# Patient Record
Sex: Male | Born: 1970 | Race: White | Hispanic: No | Marital: Married | Smoking: Current some day smoker
Health system: Southern US, Community
[De-identification: ages and names within clinical notes are randomized; demographics above are authoritative.]

## PROBLEM LIST (undated history)

## (undated) DIAGNOSIS — K819 Cholecystitis, unspecified: Secondary | ICD-10-CM

## (undated) DIAGNOSIS — F329 Major depressive disorder, single episode, unspecified: Secondary | ICD-10-CM

## (undated) DIAGNOSIS — F419 Anxiety disorder, unspecified: Secondary | ICD-10-CM

## (undated) DIAGNOSIS — G4730 Sleep apnea, unspecified: Secondary | ICD-10-CM

## (undated) DIAGNOSIS — E119 Type 2 diabetes mellitus without complications: Secondary | ICD-10-CM

## (undated) DIAGNOSIS — F1010 Alcohol abuse, uncomplicated: Secondary | ICD-10-CM

## (undated) DIAGNOSIS — I10 Essential (primary) hypertension: Secondary | ICD-10-CM

## (undated) DIAGNOSIS — I5042 Chronic combined systolic (congestive) and diastolic (congestive) heart failure: Secondary | ICD-10-CM

## (undated) DIAGNOSIS — I272 Pulmonary hypertension, unspecified: Secondary | ICD-10-CM

## (undated) DIAGNOSIS — R7989 Other specified abnormal findings of blood chemistry: Secondary | ICD-10-CM

## (undated) DIAGNOSIS — E2740 Unspecified adrenocortical insufficiency: Secondary | ICD-10-CM

## (undated) DIAGNOSIS — E785 Hyperlipidemia, unspecified: Secondary | ICD-10-CM

## (undated) DIAGNOSIS — F32A Depression, unspecified: Secondary | ICD-10-CM

## (undated) HISTORY — DX: Morbid (severe) obesity due to excess calories: E66.01

## (undated) HISTORY — DX: Type 2 diabetes mellitus without complications: E11.9

## (undated) HISTORY — DX: Chronic combined systolic (congestive) and diastolic (congestive) heart failure: I50.42

## (undated) HISTORY — DX: Hyperlipidemia, unspecified: E78.5

## (undated) HISTORY — DX: Anxiety disorder, unspecified: F41.9

## (undated) HISTORY — DX: Pulmonary hypertension, unspecified: I27.20

## (undated) HISTORY — DX: Alcohol abuse, uncomplicated: F10.10

## (undated) HISTORY — DX: Unspecified adrenocortical insufficiency: E27.40

## (undated) HISTORY — DX: Cholecystitis, unspecified: K81.9

## (undated) SURGERY — ERCP, WITH INTERVENTION IF INDICATED
Anesthesia: Choice

---

## 2010-03-31 ENCOUNTER — Inpatient Hospital Stay (HOSPITAL_COMMUNITY): Admission: EM | Admit: 2010-03-31 | Discharge: 2010-04-03 | Payer: Self-pay | Admitting: Emergency Medicine

## 2010-04-01 ENCOUNTER — Ambulatory Visit: Payer: Self-pay | Admitting: Internal Medicine

## 2010-12-24 LAB — DIFFERENTIAL
Basophils Absolute: 0.1 10*3/uL (ref 0.0–0.1)
Basophils Relative: 1 % (ref 0–1)
Blasts: 0 %
Eosinophils Relative: 1 % (ref 0–5)
Lymphocytes Relative: 26 % (ref 12–46)
Lymphs Abs: 1.2 10*3/uL (ref 0.7–4.0)
Metamyelocytes Relative: 0 %
Monocytes Absolute: 0.9 10*3/uL (ref 0.1–1.0)
Monocytes Relative: 6 % (ref 3–12)
Myelocytes: 0 %
nRBC: 0 /100 WBC

## 2010-12-24 LAB — COMPREHENSIVE METABOLIC PANEL
ALT: 248 U/L — ABNORMAL HIGH (ref 0–53)
ALT: 305 U/L — ABNORMAL HIGH (ref 0–53)
ALT: 338 U/L — ABNORMAL HIGH (ref 0–53)
AST: 168 U/L — ABNORMAL HIGH (ref 0–37)
AST: 228 U/L — ABNORMAL HIGH (ref 0–37)
Albumin: 2.8 g/dL — ABNORMAL LOW (ref 3.5–5.2)
Albumin: 3.1 g/dL — ABNORMAL LOW (ref 3.5–5.2)
Albumin: 3.5 g/dL (ref 3.5–5.2)
Alkaline Phosphatase: 514 U/L — ABNORMAL HIGH (ref 39–117)
Alkaline Phosphatase: 525 U/L — ABNORMAL HIGH (ref 39–117)
BUN: 12 mg/dL (ref 6–23)
BUN: 18 mg/dL (ref 6–23)
BUN: 9 mg/dL (ref 6–23)
CO2: 22 mEq/L (ref 19–32)
CO2: 26 mEq/L (ref 19–32)
Calcium: 8.8 mg/dL (ref 8.4–10.5)
Chloride: 100 mEq/L (ref 96–112)
Chloride: 101 mEq/L (ref 96–112)
Chloride: 105 mEq/L (ref 96–112)
Creatinine, Ser: 0.88 mg/dL (ref 0.4–1.5)
GFR calc Af Amer: >60 mL/min (ref >60)
GFR calc Af Amer: >60 mL/min (ref >60)
GFR calc non Af Amer: >60 mL/min (ref >60)
GFR calc non Af Amer: >60 mL/min (ref >60)
Glucose, Bld: 79 mg/dL (ref 70–99)
Glucose, Bld: 86 mg/dL (ref 70–99)
Glucose, Bld: 89 mg/dL (ref 70–99)
Potassium: 3.8 mEq/L (ref 3.5–5.1)
Potassium: 3.9 mEq/L (ref 3.5–5.1)
Potassium: 5.9 mEq/L — ABNORMAL HIGH (ref 3.5–5.1)
Sodium: 132 mEq/L — ABNORMAL LOW (ref 135–145)
Sodium: 134 mEq/L — ABNORMAL LOW (ref 135–145)
Sodium: 135 mEq/L (ref 135–145)
Total Bilirubin: 11.5 mg/dL — ABNORMAL HIGH (ref 0.3–1.2)
Total Protein: 6.6 g/dL (ref 6.0–8.3)
Total Protein: 7.5 g/dL (ref 6.0–8.3)

## 2010-12-24 LAB — URINE MICROSCOPIC-ADD ON

## 2010-12-24 LAB — CBC
HCT: 36.8 % — ABNORMAL LOW (ref 39.0–52.0)
Hemoglobin: 12.8 g/dL — ABNORMAL LOW (ref 13.0–17.0)
Hemoglobin: 13.7 g/dL (ref 13.0–17.0)
MCV: 95.2 fL (ref 78.0–100.0)
MCV: 95.5 fL (ref 78.0–100.0)
Platelets: 241 10*3/uL (ref 150–400)
RBC: 3.76 MIL/uL — ABNORMAL LOW (ref 4.22–5.81)
RBC: 3.86 MIL/uL — ABNORMAL LOW (ref 4.22–5.81)
RDW: 15.1 % (ref 11.5–15.5)
RDW: 15.2 % (ref 11.5–15.5)
WBC: 5.8 10*3/uL (ref 4.0–10.5)
WBC: 6.7 10*3/uL (ref 4.0–10.5)

## 2010-12-24 LAB — HEPATIC FUNCTION PANEL
ALT: 353 U/L — ABNORMAL HIGH (ref 0–53)
AST: 234 U/L — ABNORMAL HIGH (ref 0–37)
Alkaline Phosphatase: 507 U/L — ABNORMAL HIGH (ref 39–117)
Bilirubin, Direct: 8.2 mg/dL — ABNORMAL HIGH (ref 0.0–0.3)
Indirect Bilirubin: 4.2 mg/dL — ABNORMAL HIGH (ref 0.3–0.9)
Total Bilirubin: 12.4 mg/dL — ABNORMAL HIGH (ref 0.3–1.2)

## 2010-12-24 LAB — URINALYSIS, ROUTINE W REFLEX MICROSCOPIC
Ketones, ur: 15 mg/dL — AB
Nitrite: POSITIVE — AB
Protein, ur: NEGATIVE mg/dL
Urobilinogen, UA: 1 mg/dL (ref 0.0–1.0)
pH: 5.5 (ref 5.0–8.0)

## 2010-12-24 LAB — HCV RNA QUANT

## 2010-12-24 LAB — MITOCHONDRIAL ANTIBODIES: Mitochondrial M2 Ab, IgG: NEGATIVE

## 2010-12-24 LAB — ANTI-SMOOTH MUSCLE ANTIBODY, IGG: F-Actin IgG: <20 U (ref <20)

## 2010-12-24 LAB — AMMONIA: Ammonia: 101 umol/L — ABNORMAL HIGH (ref 11–35)

## 2010-12-24 LAB — ANTI-NUCLEAR AB-TITER (ANA TITER): ANA Titer 1: 1:160 {titer} — ABNORMAL HIGH

## 2010-12-24 LAB — PROTIME-INR: INR: 0.87 (ref 0.00–1.49)

## 2010-12-24 LAB — HEPATITIS B SURFACE ANTIGEN: Hepatitis B Surface Ag: NEGATIVE

## 2010-12-24 LAB — HEPATITIS A ANTIBODY, IGM: Hep A IgM: NEGATIVE

## 2015-05-11 ENCOUNTER — Encounter (HOSPITAL_COMMUNITY): Payer: Self-pay | Admitting: Cardiology

## 2015-05-11 ENCOUNTER — Emergency Department (HOSPITAL_COMMUNITY): Payer: Medicaid Other

## 2015-05-11 ENCOUNTER — Ambulatory Visit (HOSPITAL_COMMUNITY)
Admission: EM | Admit: 2015-05-11 | Discharge: 2015-05-17 | Disposition: A | Discharge status: Home / Self Care | Payer: Medicaid Other | Attending: Internal Medicine | Admitting: Internal Medicine

## 2015-05-11 DIAGNOSIS — R748 Abnormal levels of other serum enzymes: Secondary | ICD-10-CM | POA: Insufficient documentation

## 2015-05-11 DIAGNOSIS — F17290 Nicotine dependence, other tobacco product, uncomplicated: Secondary | ICD-10-CM | POA: Insufficient documentation

## 2015-05-11 DIAGNOSIS — I5040 Unspecified combined systolic (congestive) and diastolic (congestive) heart failure: Secondary | ICD-10-CM | POA: Insufficient documentation

## 2015-05-11 DIAGNOSIS — K8020 Calculus of gallbladder without cholecystitis without obstruction: Secondary | ICD-10-CM | POA: Diagnosis present

## 2015-05-11 DIAGNOSIS — E860 Dehydration: Secondary | ICD-10-CM | POA: Insufficient documentation

## 2015-05-11 DIAGNOSIS — N179 Acute kidney failure, unspecified: Secondary | ICD-10-CM | POA: Diagnosis present

## 2015-05-11 DIAGNOSIS — Z6841 Body Mass Index (BMI) 40.0 and over, adult: Secondary | ICD-10-CM | POA: Diagnosis not present

## 2015-05-11 DIAGNOSIS — F419 Anxiety disorder, unspecified: Secondary | ICD-10-CM | POA: Insufficient documentation

## 2015-05-11 DIAGNOSIS — I272 Other secondary pulmonary hypertension: Secondary | ICD-10-CM | POA: Insufficient documentation

## 2015-05-11 DIAGNOSIS — R109 Unspecified abdominal pain: Secondary | ICD-10-CM | POA: Diagnosis present

## 2015-05-11 DIAGNOSIS — R17 Unspecified jaundice: Secondary | ICD-10-CM | POA: Insufficient documentation

## 2015-05-11 DIAGNOSIS — Z79899 Other long term (current) drug therapy: Secondary | ICD-10-CM | POA: Diagnosis not present

## 2015-05-11 DIAGNOSIS — I429 Cardiomyopathy, unspecified: Secondary | ICD-10-CM

## 2015-05-11 DIAGNOSIS — G4730 Sleep apnea, unspecified: Secondary | ICD-10-CM

## 2015-05-11 DIAGNOSIS — G4733 Obstructive sleep apnea (adult) (pediatric): Secondary | ICD-10-CM

## 2015-05-11 DIAGNOSIS — E785 Hyperlipidemia, unspecified: Secondary | ICD-10-CM | POA: Diagnosis not present

## 2015-05-11 DIAGNOSIS — E119 Type 2 diabetes mellitus without complications: Secondary | ICD-10-CM | POA: Diagnosis present

## 2015-05-11 DIAGNOSIS — R739 Hyperglycemia, unspecified: Secondary | ICD-10-CM | POA: Insufficient documentation

## 2015-05-11 DIAGNOSIS — K8050 Calculus of bile duct without cholangitis or cholecystitis without obstruction: Secondary | ICD-10-CM | POA: Diagnosis present

## 2015-05-11 DIAGNOSIS — K8070 Calculus of gallbladder and bile duct without cholecystitis without obstruction: Secondary | ICD-10-CM | POA: Diagnosis not present

## 2015-05-11 DIAGNOSIS — I428 Other cardiomyopathies: Secondary | ICD-10-CM

## 2015-05-11 DIAGNOSIS — F10239 Alcohol dependence with withdrawal, unspecified: Secondary | ICD-10-CM | POA: Insufficient documentation

## 2015-05-11 DIAGNOSIS — I509 Heart failure, unspecified: Secondary | ICD-10-CM

## 2015-05-11 DIAGNOSIS — I420 Dilated cardiomyopathy: Secondary | ICD-10-CM | POA: Diagnosis not present

## 2015-05-11 DIAGNOSIS — R778 Other specified abnormalities of plasma proteins: Secondary | ICD-10-CM | POA: Diagnosis present

## 2015-05-11 DIAGNOSIS — I10 Essential (primary) hypertension: Secondary | ICD-10-CM | POA: Diagnosis present

## 2015-05-11 DIAGNOSIS — R7989 Other specified abnormal findings of blood chemistry: Secondary | ICD-10-CM | POA: Diagnosis present

## 2015-05-11 HISTORY — DX: Depression, unspecified: F32.A

## 2015-05-11 HISTORY — DX: Essential (primary) hypertension: I10

## 2015-05-11 HISTORY — DX: Major depressive disorder, single episode, unspecified: F32.9

## 2015-05-11 HISTORY — DX: Sleep apnea, unspecified: G47.30

## 2015-05-11 HISTORY — DX: Other specified abnormal findings of blood chemistry: R79.89

## 2015-05-11 LAB — CBC WITH DIFFERENTIAL/PLATELET
BASOS PCT: 0 % (ref 0–1)
Basophils Absolute: 0 10*3/uL (ref 0.0–0.1)
EOS ABS: 0 10*3/uL (ref 0.0–0.7)
Eosinophils Relative: 1 % (ref 0–5)
HCT: 50 % (ref 39.0–52.0)
Hemoglobin: 17.1 g/dL — ABNORMAL HIGH (ref 13.0–17.0)
LYMPHS ABS: 1.2 10*3/uL (ref 0.7–4.0)
Lymphocytes Relative: 17 % (ref 12–46)
MCH: 33.8 pg (ref 26.0–34.0)
MCHC: 34.2 g/dL (ref 30.0–36.0)
MCV: 98.8 fL (ref 78.0–100.0)
Monocytes Absolute: 0.6 10*3/uL (ref 0.1–1.0)
Monocytes Relative: 8 % (ref 3–12)
NEUTROS PCT: 74 % (ref 43–77)
Neutro Abs: 5.2 10*3/uL (ref 1.7–7.7)
Platelets: 197 10*3/uL (ref 150–400)
RBC: 5.06 MIL/uL (ref 4.22–5.81)
RDW: 14.3 % (ref 11.5–15.5)
WBC: 7 10*3/uL (ref 4.0–10.5)

## 2015-05-11 LAB — URINALYSIS, ROUTINE W REFLEX MICROSCOPIC
Bilirubin Urine: NEGATIVE
GLUCOSE, UA: NEGATIVE mg/dL
HGB URINE DIPSTICK: NEGATIVE
Ketones, ur: NEGATIVE mg/dL
LEUKOCYTES UA: NEGATIVE
NITRITE: NEGATIVE
PH: 8.5 — AB (ref 5.0–8.0)
Protein, ur: 30 mg/dL — AB
Specific Gravity, Urine: 1.017 (ref 1.005–1.030)
UROBILINOGEN UA: 1 mg/dL (ref 0.0–1.0)

## 2015-05-11 LAB — COMPREHENSIVE METABOLIC PANEL
ALK PHOS: 115 U/L (ref 38–126)
ALT: 181 U/L — AB (ref 17–63)
ANION GAP: 12 (ref 5–15)
AST: 254 U/L — AB (ref 15–41)
Albumin: 3.9 g/dL (ref 3.5–5.0)
BILIRUBIN TOTAL: 2.4 mg/dL — AB (ref 0.3–1.2)
BUN: 10 mg/dL (ref 6–20)
CHLORIDE: 96 mmol/L — AB (ref 101–111)
CO2: 27 mmol/L (ref 22–32)
CREATININE: 1.22 mg/dL (ref 0.61–1.24)
Calcium: 9.1 mg/dL (ref 8.9–10.3)
GFR calc Af Amer: >60 mL/min (ref >60)
GLUCOSE: 203 mg/dL — AB (ref 65–99)
POTASSIUM: 3.9 mmol/L (ref 3.5–5.1)
SODIUM: 135 mmol/L (ref 135–145)
TOTAL PROTEIN: 7.5 g/dL (ref 6.5–8.1)

## 2015-05-11 LAB — URINE MICROSCOPIC-ADD ON

## 2015-05-11 LAB — TROPONIN I
TROPONIN I: 0.04 ng/mL — AB (ref <0.031)
Troponin I: 0.06 ng/mL — ABNORMAL HIGH (ref <0.031)

## 2015-05-11 LAB — LIPASE, BLOOD: Lipase: 172 U/L — ABNORMAL HIGH (ref 22–51)

## 2015-05-11 MED ORDER — FENTANYL CITRATE (PF) 100 MCG/2ML IJ SOLN: 50.0000 ug | Freq: Once | INTRAMUSCULAR | Status: DC

## 2015-05-11 MED ORDER — HYDROMORPHONE HCL 1 MG/ML IJ SOLN
1.0000 mg | Freq: Once | INTRAMUSCULAR | Status: AC
  Administered 2015-05-11: 1 mg via INTRAVENOUS
  Filled 2015-05-11: qty 1

## 2015-05-11 MED ORDER — LABETALOL HCL 5 MG/ML IV SOLN: 20.0000 mg | Freq: Once | INTRAVENOUS | Status: DC

## 2015-05-11 MED ORDER — SODIUM CHLORIDE 0.9 % IV SOLN
INTRAVENOUS | Status: AC
  Administered 2015-05-11 – 2015-05-12 (×3): via INTRAVENOUS

## 2015-05-11 MED ORDER — ONDANSETRON HCL 4 MG/2ML IJ SOLN
4.0000 mg | Freq: Four times a day (QID) | INTRAMUSCULAR | Status: DC | PRN
Start: 2015-05-11 — End: 2015-05-17
  Administered 2015-05-11 – 2015-05-14 (×3): 4 mg via INTRAVENOUS
  Filled 2015-05-11 (×3): qty 2

## 2015-05-11 MED ORDER — ONDANSETRON HCL 4 MG/2ML IJ SOLN
4.0000 mg | Freq: Once | INTRAMUSCULAR | Status: AC
  Administered 2015-05-11: 4 mg via INTRAVENOUS
  Filled 2015-05-11: qty 2

## 2015-05-11 MED ORDER — CARVEDILOL 12.5 MG PO TABS
12.5000 mg | ORAL_TABLET | Freq: Two times a day (BID) | ORAL | Status: DC
  Administered 2015-05-11 – 2015-05-17 (×12): 12.5 mg via ORAL
  Filled 2015-05-11 (×11): qty 1

## 2015-05-11 MED ORDER — CARVEDILOL 12.5 MG PO TABS
12.5000 mg | ORAL_TABLET | Freq: Once | ORAL | Status: AC
  Administered 2015-05-11: 12.5 mg via ORAL
  Filled 2015-05-11 (×3): qty 1

## 2015-05-11 MED ORDER — FENTANYL CITRATE (PF) 100 MCG/2ML IJ SOLN
50.0000 ug | Freq: Once | INTRAMUSCULAR | Status: AC
  Administered 2015-05-11: 50 ug via INTRAVENOUS
  Filled 2015-05-11: qty 2

## 2015-05-11 MED ORDER — SODIUM CHLORIDE 0.9 % IV SOLN
1000.0000 mL | Freq: Once | INTRAVENOUS | Status: AC
  Administered 2015-05-11: 1000 mL via INTRAVENOUS

## 2015-05-11 MED ORDER — HYDROMORPHONE HCL 1 MG/ML IJ SOLN
1.0000 mg | Freq: Once | INTRAMUSCULAR | Status: AC
  Administered 2015-05-11: 1 mg via INTRAVENOUS

## 2015-05-11 MED ORDER — GI COCKTAIL ~~LOC~~
30.0000 mL | Freq: Once | ORAL | Status: DC
  Filled 2015-05-11: qty 30

## 2015-05-11 MED ORDER — ONDANSETRON HCL 4 MG PO TABS: 4.0000 mg | ORAL_TABLET | Freq: Four times a day (QID) | ORAL | Status: DC | PRN

## 2015-05-11 MED ORDER — HYDROMORPHONE HCL 1 MG/ML IJ SOLN
1.0000 mg | INTRAMUSCULAR | Status: DC | PRN
  Administered 2015-05-11 (×2): 1 mg via INTRAVENOUS
  Filled 2015-05-11 (×3): qty 1

## 2015-05-11 MED ORDER — HYDRALAZINE HCL 20 MG/ML IJ SOLN
20.0000 mg | Freq: Once | INTRAMUSCULAR | Status: AC
  Administered 2015-05-11: 20 mg via INTRAVENOUS
  Filled 2015-05-11: qty 1

## 2015-05-11 MED ORDER — LABETALOL HCL 5 MG/ML IV SOLN
10.0000 mg | Freq: Once | INTRAVENOUS | Status: AC
Start: 2015-05-11 — End: 2015-05-11
  Administered 2015-05-11: 10 mg via INTRAVENOUS
  Filled 2015-05-11: qty 4

## 2015-05-11 MED ORDER — HYDRALAZINE HCL 20 MG/ML IJ SOLN
20.0000 mg | Freq: Once | INTRAMUSCULAR | Status: AC
  Administered 2015-05-11: 10 mg via INTRAVENOUS
  Filled 2015-05-11: qty 1

## 2015-05-11 MED ORDER — ALUM & MAG HYDROXIDE-SIMETH 200-200-20 MG/5ML PO SUSP
30.0000 mL | Freq: Once | ORAL | Status: AC
  Administered 2015-05-11: 30 mL via ORAL
  Filled 2015-05-11: qty 30

## 2015-05-11 MED ORDER — HEPARIN SODIUM (PORCINE) 5000 UNIT/ML IJ SOLN
5000.0000 [IU] | Freq: Three times a day (TID) | INTRAMUSCULAR | Status: DC
  Administered 2015-05-11 – 2015-05-17 (×9): 5000 [IU] via SUBCUTANEOUS
  Filled 2015-05-11 (×10): qty 1

## 2015-05-11 MED ORDER — HYDROMORPHONE HCL 1 MG/ML IJ SOLN
1.0000 mg | INTRAMUSCULAR | Status: DC | PRN
  Administered 2015-05-12: 1 mg via INTRAVENOUS
  Administered 2015-05-12 (×2): 2 mg via INTRAVENOUS
  Administered 2015-05-12: 1 mg via INTRAVENOUS
  Administered 2015-05-13 – 2015-05-14 (×4): 2 mg via INTRAVENOUS
  Administered 2015-05-14: 1 mg via INTRAVENOUS
  Administered 2015-05-14 – 2015-05-15 (×5): 2 mg via INTRAVENOUS
  Administered 2015-05-15: 1 mg via INTRAVENOUS
  Administered 2015-05-16 (×2): 2 mg via INTRAVENOUS
  Administered 2015-05-16: 1 mg via INTRAVENOUS
  Administered 2015-05-17 (×2): 2 mg via INTRAVENOUS
  Filled 2015-05-11 (×5): qty 2
  Filled 2015-05-11: qty 1
  Filled 2015-05-11 (×2): qty 2
  Filled 2015-05-11: qty 1
  Filled 2015-05-11 (×8): qty 2
  Filled 2015-05-11: qty 1
  Filled 2015-05-11: qty 2
  Filled 2015-05-11: qty 1
  Filled 2015-05-11: qty 2

## 2015-05-11 MED ORDER — HYDRALAZINE HCL 20 MG/ML IJ SOLN
10.0000 mg | INTRAMUSCULAR | Status: AC | PRN
  Administered 2015-05-11 – 2015-05-14 (×2): 10 mg via INTRAVENOUS
  Filled 2015-05-11 (×2): qty 1

## 2015-05-11 NOTE — ED Provider Notes (Signed)
CSN: [id number]     Arrival date & time 05/11/15  6384 History   First MD Initiated Contact with Patient 05/11/15 [phone]     Chief Complaint  Patient presents with  . Abdominal Pain     (Consider location/radiation/quality/duration/timing/severity/associated sxs/prior Treatment) HPI Patient presents with the acute onset of abdominal pain, nausea, vomiting. Patient developed symptoms about 5 hours ago, awakening him. Since onset patient has had severe pain in the epigastrium, radiating bilaterally in the upper abdomen. Pain is severe, crampy, sharp. Associated vomiting, but no diarrhea. No associated chest pain or dyspnea. No fever. Patient does acknowledge chills, diaphoresis. Patient has a history of gallstones, no prior cholecystectomy. Patient drinks minimally, does not smoke. No history of abdominal surgery. No relief with anything. Symptoms are marginally better spontaneously. Past Medical History  Diagnosis Date  . Hypertension    History reviewed. No pertinent past surgical history. History reviewed. No pertinent family history. History  Substance Use Topics  . Smoking status: Never Smoker   . Smokeless tobacco: Not on file  . Alcohol Use: Yes    Review of Systems  Constitutional:       Per HPI, otherwise negative  HENT:       Per HPI, otherwise negative  Respiratory:       Per HPI, otherwise negative  Cardiovascular:       Per HPI, otherwise negative  Gastrointestinal: Positive for vomiting.  Endocrine:       Negative aside from HPI  Genitourinary:       Neg aside from HPI   Musculoskeletal:       Per HPI, otherwise negative  Skin: Positive for color change and pallor.  Neurological: Negative for syncope.      Allergies  Benzocaine  Home Medications   Prior to Admission medications   Medication Sig Start Date End Date Taking? Authorizing Provider  losartan-hydrochlorothiazide (HYZAAR) 100-25 MG per tablet Take by mouth. 03/18/15 03/17/16 Yes  Historical Provider, MD  testosterone cypionate (DEPOTESTOSTERONE CYPIONATE) 200 MG/ML injection Inject into the muscle. 11/05/14  Yes Historical Provider, MD   BP 205/113 mmHg  Pulse 86  Temp(Src) 98.2 F (36.8 C) (Oral)  Resp 16  SpO2 97% Physical Exam  Constitutional: He is oriented to person, place, and time. He appears well-developed. No distress.  HENT:  Head: Normocephalic and atraumatic.  Eyes: Conjunctivae and EOM are normal.  Cardiovascular: Normal rate and regular rhythm.   Pulmonary/Chest: Effort normal. No stridor. No respiratory distress.  Abdominal: He exhibits no distension. There is tenderness in the right upper quadrant and epigastric area.  Musculoskeletal: He exhibits no edema.  Neurological: He is alert and oriented to person, place, and time.  Skin: Skin is warm. There is pallor.  Psychiatric: He has a normal mood and affect.  Nursing note and vitals reviewed.   ED Course  Procedures (including critical care time) Labs Review Labs Reviewed  CBC WITH DIFFERENTIAL/PLATELET - Abnormal; Notable for the following:    Hemoglobin 17.1 (*)    All other components within normal limits  COMPREHENSIVE METABOLIC PANEL - Abnormal; Notable for the following:    Chloride 96 (*)    Glucose, Bld 203 (*)    AST 254 (*)    ALT 181 (*)    Total Bilirubin 2.4 (*)    All other components within normal limits  LIPASE, BLOOD - Abnormal; Notable for the following:    Lipase 172 (*)    All other components within normal limits  TROPONIN I -  Abnormal; Notable for the following:    Troponin I 0.04 (*)    All other components within normal limits  URINALYSIS, ROUTINE W REFLEX MICROSCOPIC (NOT AT [facility])    Imaging Review US Abdomen Limited  05/11/2015   CLINICAL DATA:  Epigastric pain  EXAM: US ABDOMEN LIMITED - RIGHT UPPER QUADRANT  COMPARISON:  MRCP 04/02/10  FINDINGS: Gallbladder:  Contracted gallbladder with multiple gallstones. There is mild thickening of gallbladder wall up  to 3.2 mm. No sonographic Murphy's sign.  Common bile duct:  Diameter: There is distended CBD up to 10.8 mm. Probable stone within CBD measures at least 1.2 cm. Further correlation with ERCP or MRCP could be performed.  Liver:  There is diffuse increased echogenicity of the liver. Mild intrahepatic biliary ductal dilatation. No focal hepatic mass.  IMPRESSION: Contracted gallbladder. Multiple gallstones are noted within gallbladder. There is CBD dilatation up to 10.8 mm. Probable stone within CBD measures at least 1.2 cm. Diffuse increased echogenicity of the liver consistent with fatty infiltration. Mild intrahepatic biliary ductal dilatation.  These results were called by telephone at the time of interpretation on 05/11/2015 at 12:08 pm to Dr. Gerhard Munch , who verbally acknowledged these results.   Electronically Signed   By: Natasha Mead M.D.   On: 05/11/2015 12:08     EKG Interpretation   Date/Time:  Wednesday May 11 2015 11:27:43 EDT Ventricular Rate:  99 PR Interval:  168 QRS Duration: 101 QT Interval:  377 QTC Calculation: 484 R Axis:   65 Text Interpretation:  Sinus rhythm Minimal ST depression, inferior leads  Borderline prolonged QT interval Sinus rhythm ST-t wave abnormality  Abnormal ekg Confirmed by Gerhard Munch  MD ([id number]) on 05/11/2015 1:25:25  PM     Pulse ox 100% room air normal Initial blood pressure 205/113  After the initial evaluation patient received fluids and analgesia, antiemetics. Ultrasound was performed. I discussed ultrasound findings with our radiologist. With concern for common bile duct stone, cholecystitis, discussed patient's case with surgery, and with gastroenterology.  On repeat exam the patient noted diminished pain, though not resolved. No upper chest pain. EKG does not demonstrate substantial ST wave changes. Patient received fluid resuscitation continuously, and after 2 L remained hemodynamically stable.  Patient is hypertensive, though he  improved with hydralazine. Given his persistent pain, there suspicion for this aggravating his condition. No evidence for other end organ effects, with no renal dysfunction, neurologic changes   MDM  Patient presents with abdominal pain, nausea, vomiting. Here patient has multiple abnormalities, including common bile duct stone, elevated lipase, findings concerning for hepatobiliary inflammation versus infection. Patient does have minimally elevated troponin, though with no chest pain, and  use, in addition to nonischemic EKG count is low suspicion for concurrent coronary ischemia, though there is likely a demand component. Patient received fluid resuscitation, analgesia, antiemetics, and after discussed this case with multiple surgical specialties, he was admitted for further evaluation and management.    Gerhard Munch, MD 05/11/15 [phone]

## 2015-05-11 NOTE — ED Notes (Signed)
Pt reports n/v that started this morning around 730. States that he made himself vomiting once this morning. Reports the pain is in the epigastric region and feels pressure there. Denies any bowel or bladder changes.

## 2015-05-11 NOTE — H&P (Signed)
Date: 05/11/2015               Patient Name:  Bruce Alvarez MRN: [id number]  DOB: 12-07-1970 Age / Sex: 44 y.o., male   PCP: No primary care provider on file.         Medical Service: Internal Medicine Teaching Service, B2 [facility]         Attending Physician: Dr. Doneen Poisson, MD    First Contact: Carolynn Comment, MS4 Pager: [phone]  Second Contact: Dr. Mariea Clonts Pager: [phone]       After Hours (After 5p/  First Contact Pager: [phone]  weekends / holidays): Second Contact Pager: [phone]   Chief Complaint:  Epigastric pain  History of Present Illness: Bruce Alvarez is a 44 y.o. male with a h/o of HTN, hypo-testosterone, and anxiety who presents with severe midepigastric pain since 2AM this morning. He reports that he awoke from sleep at 2AM with severe stomach pain, but without any nausea/vomitting, or diarrhea/constipation. He endorses having eaten a dinner of pork roast around 9pm the night before, and endorses a mildly decreased appetite during the previous day. He reports that he forced himself to vomit once this AM, but had no relief of his symptoms. He notes that he has had 1 similar episode in 2011 which was characterized by jaundice and and evaluation for GB stones, but this episode was less severe in terms of pain and self-limited, as a result, cholecystectomy was not pursued at that time. He underwent an investigation for autoimmune hepatits, which was not significant except for a trace elevation in ANA. His troponins were 0.04 x2 in the ED and EKG was unremarkable for CAD/ischemia.  He reports that his pain is 10/10 and constant, causing him to move around and unable to get comfortable. He denies any changes in bowel habits, bloody stools, or diarrhea. He denies pain with urination, but does note a change in the color of his urine (dark) x 1 wk which he attributes to some dehydration. He denies fevers or chills, but endorses sweats related to his severe pain. He denies CP or SOB, but  endorses the inability to breathe deeply due to significant epigastric pain with inspiration. He did not attempt any remedy except the pain medications he received in the ED. He endorses alcohol consumption of "several fingers" of brandy every day, and endorses drinking more on the weekends. He smokes several cigars each week, as he is the owner of a cigar shop.  He denies any family history of liver disease, but does not some family history of CAD with MI in his father at 35y.o. And MI in his grandfather at 32y.o.  Meds: Current Outpatient Rx  Name  Route  Sig  Dispense  Refill  . losartan-hydrochlorothiazide (HYZAAR) 100-25 MG per tablet   Oral   Take 1 tablet by mouth daily.          Marland Kitchen MILK THISTLE PO   Oral   Take 1 capsule by mouth daily.         . Multiple Vitamin (MULTIVITAMIN WITH MINERALS) TABS tablet   Oral   Take 1 tablet by mouth daily.         Marland Kitchen testosterone cypionate (DEPOTESTOSTERONE CYPIONATE) 200 MG/ML injection   Intramuscular   Inject 100 mg into the muscle every 14 (fourteen) days.            Allergies: Allergies as of 05/11/2015 - Review Complete 05/11/2015  Allergen Reaction Noted  . Benzocaine  Swelling 05/11/2015  . Other  05/11/2015   Past Medical History  Diagnosis Date  . Hypertension   . Low testosterone    Past Surgical History  Procedure Laterality Date  . None     Family History  Problem Relation Age of Onset  . Heart attack Father 28  . Heart attack Paternal Grandfather 54  . Breast cancer Mother    History   Social History  . Marital Status: Married    Spouse Name: N/A  . Number of Children: N/A  . Years of Education: N/A   Occupational History  . Therapist, occupational   Social History Main Topics  . Smoking status: Current Every Day Smoker    Types: Cigars  . Smokeless tobacco: Never Used  . Alcohol Use: 8.4 oz/week    14 Shots of liquor per week  . Drug Use: No  . Sexual Activity: Not on file    Other Topics Concern  . Not on file   Social History Narrative    Review of Systems: Pertinent items are noted in HPI.  Physical Exam: Blood pressure 219/107, pulse 96, temperature 98.2 F (36.8 C), temperature source Oral, resp. rate 25, SpO2 94 %. General appearance: alert, cooperative, moderate distress and moderately obese Head: Normocephalic, without obvious abnormality, atraumatic Eyes: no icteris, pupils constricted and minimally (likely 2/2 opiates) Lungs: CTAB, diminished breath sounds throughout, likely 2/2 poor effort and pain Heart: regular rate and rhythm, S1, S2 normal, no murmur, click, rub or gallop Abdomen: abnormal findings - hypoactive bowel sounds, obese and moderate tenderness in the epigastrium Extremities: extremities normal, atraumatic, no cyanosis, edema trace in the LLE Pulses: 2+ and symmetric Skin: Skin color, texture, turgor normal. Chronic venous stasis changes on BLE Neurologic: Grossly normal  Labs:  Basic Metabolic Panel:  Recent Labs Lab 05/11/15 1015  NA 135  K 3.9  CL 96*  CO2 27  GLUCOSE 203*  BUN 10  CREATININE 1.22  CALCIUM 9.1    Liver Function Tests:  Recent Labs Lab 05/11/15 1015  AST 254*  ALT 181*  ALKPHOS 115  BILITOT 2.4*  PROT 7.5  ALBUMIN 3.9    Recent Labs Lab 05/11/15 1015  LIPASE 172*    CBC:  Recent Labs Lab 05/11/15 1015  WBC 7.0  NEUTROABS 5.2  HGB 17.1*  HCT 50.0  MCV 98.8  PLT 197    Cardiac Enzymes:  Recent Labs Lab 05/11/15 1015  TROPONINI 0.04*    Other results: EKG: normal EKG, normal sinus rhythm, unchanged from previous tracings.  Imaging: US Abdomen Limited  05/11/2015   CLINICAL DATA:  Epigastric pain  EXAM: US ABDOMEN LIMITED - RIGHT UPPER QUADRANT  COMPARISON:  MRCP 04/02/10  FINDINGS: Gallbladder:  Contracted gallbladder with multiple gallstones. There is mild thickening of gallbladder wall up to 3.2 mm. No sonographic Murphy's sign.  Common bile duct:  Diameter:  There is distended CBD up to 10.8 mm. Probable stone within CBD measures at least 1.2 cm. Further correlation with ERCP or MRCP could be performed.  Liver:  There is diffuse increased echogenicity of the liver. Mild intrahepatic biliary ductal dilatation. No focal hepatic mass.  IMPRESSION: Contracted gallbladder. Multiple gallstones are noted within gallbladder. There is CBD dilatation up to 10.8 mm. Probable stone within CBD measures at least 1.2 cm. Diffuse increased echogenicity of the liver consistent with fatty infiltration. Mild intrahepatic biliary ductal dilatation.  These results were called by telephone at the time  of interpretation on 05/11/2015 at 12:08 pm to Dr. Gerhard Munch , who verbally acknowledged these results.   Electronically Signed   By: Natasha Mead M.D.   On: 05/11/2015 12:08     Assessment & Plan by Problem: Principal Problem:   Choledocholithiasis Active Problems:   Hypertension   Low testosterone  Bruce Alvarez is a 44 y.o. male with a history of HTN and Low T who presents with acute epigastric pain 2/2 choledocolithiasis. His management will be focused on surgical correction of his gall stones and supportive therapy for his pain and HTN.  1) Choledocolithiasis: In acute pain. LTFs moderately elevated, TBili 2.4. Non-jaundiced, non-icteric. Afeb w/o leukocytosis. h/o acute cholecystitis vs choledocolithiasis in 2011 w/o surgical management. [place] today demonstrates cholelithiasis and a 1.2cm CBD stone likely causing obstruction. -- GI consulted, plan ERCP for CBD stone removal -- Surg consulted, plan cholecystectomy follow ERCP -- Pain managed with IV Dilaudid (1mg  q2h PRN pain), consider 1mg  IV push for breakthrough pain -- IV Zofran (4mg  q6h PRN nausea) -- Trend CMet, Lipase -- NPO for GI/Surg procedures  2) Hypertension: Elevated in the setting of acute pain. Managed with 10mg  IV hydral in the ED. Will plan to manage pain aggressively and monitor HTN closely. -- Hold  home losartan/HCTZ in the setting of AKI -- Carvedilol 12.5 PO BID -- Aggressive pain management as above -- Labetalol 10mg  IV PRN systolics >251mmHg / diastolic >137mmHg  3) AKI: Acute rise in SCr to 1.22, likely 2/2 to dehydration. Baseline renal function Cr. 0.80. -- IVF 1L NS @125mL /hr -- Hold losartan/HCTZ, consider restarting if renal fxn normalizes  4) Elevated Hgb: Hgb 17.1 in the ED, last recorded at 16.8 but 15 at the beginning of the year. Likely due to hemoconcentration 2/2 dehydration. Will consider hemochromatosis vs PV. -- Follow CBC -- Fe studies + peripheral smear for Hgb increase  5) Hyperglycemia: CBG mildly elevated per PCP records at 175 on 04/01/2015, now 203 today. In the setting of acute pain, will follow for now. Consider A1C to assess for chronic BG elevations.  6) Low T: Chronic for several years, on testosterone replacement injections q 2 wks.  7) Anxiety: Recently d/c'd SSRI earlier this year. Currently asymptomatic.  FEN/GI: -- NPO for procedures   DVT PPX - heparin  CODE STATUS - Full  CONSULTS PLACED - GI, Surgery  DISPO - Admit to Internal Medicine Teaching Service, B2 [facility], Inpatient Team  This is a Psychologist, occupational Note.  The care of the patient was discussed with Dr. Mariea Clonts and the assessment and plan was formulated with their assistance.  Please see their note for official documentation of the patient encounter.   Signed: Burna Cash, MS4 Pager: [phone] 05/11/2015, 3:37 PM

## 2015-05-11 NOTE — ED Notes (Signed)
Called pharmacy for pt meds.

## 2015-05-11 NOTE — H&P (Signed)
Date: 05/11/2015               Patient Name:  Bruce Alvarez MRN: [id number]  DOB: 1971/06/30 Age / Sex: 44 y.o., male   PCP: No primary care provider on file.         Medical Service: Internal Medicine Teaching Service         Attending Physician: Dr. Josem Kaufmann    First Contact: Carolynn Comment, MS4 Pager: [phone]  Second Contact: Dr. Mariea Clonts Pager: [phone]       After Hours (After 5p/  First Contact Pager: [phone]  weekends / holidays): Second Contact Pager: [phone]   Chief Complaint: Abdominal Pain  History of Present Illness: Pt is a 56 Y O M with PMH of HTN, Obesity , presented with complaint of abdominal pain that started yesterday. Pain is constant, couldn't describe character, mostly in the middle of his upper abdomen but also goes to the right side and left side, pain does not radiate to back. Pain is aggravated by breathing and yawning, and relived by IV dilaudid in the ED. He has not taken any thing for this. He induced vomiting this morning- Once in the hopes it will relieve some of the abdominal pain he was feeling without relief. He he had just the one episodes of vomiting. Last bowel movement was yestetrday, was normal, no diarrhea or constipation. He denies fever.  He has been taking his medications- Lorsatan and HCTZ, admits he is dehydrated form reduced PO intake overthe past 2 days. Diet is consists of a lot of meat and vegetables, not a lot of fast foods. Also takes 2-3 fingers of bourborn, almost every day of the week.  He has had prior episodes of abdominal pain, last was 2011, he says this episode is worse than that, but he was more jaundiced then. Pt had RUQ Ultrasound done in the Ed that revealed Gall stones and stones in the CBD. Pt was given 1 dose of dilaudid and IVF 1L bolus and we were called to admit.    Meds: No current facility-administered medications for this encounter.   Current Outpatient Prescriptions  Medication Sig Dispense Refill  .  losartan-hydrochlorothiazide (HYZAAR) 100-25 MG per tablet Take 1 tablet by mouth daily.     Marland Kitchen MILK THISTLE PO Take 1 capsule by mouth daily.    . Multiple Vitamin (MULTIVITAMIN WITH MINERALS) TABS tablet Take 1 tablet by mouth daily.    Marland Kitchen testosterone cypionate (DEPOTESTOSTERONE CYPIONATE) 200 MG/ML injection Inject 100 mg into the muscle every 14 (fourteen) days.       Allergies: Allergies as of 05/11/2015 - Review Complete 05/11/2015  Allergen Reaction Noted  . Benzocaine Swelling 05/11/2015  . Other  05/11/2015   Past Medical History  Diagnosis Date  . Hypertension   . Low testosterone    History reviewed. No pertinent past surgical history. History reviewed. No pertinent family history. History   Social History  . Marital Status: Married    Spouse Name: N/A  . Number of Children: N/A  . Years of Education: N/A   Occupational History  . Not on file.   Social History Main Topics  . Smoking status: Current Every Day Smoker    Types: Cigars  . Smokeless tobacco: Not on file  . Alcohol Use: 8.4 oz/week    14 Shots of liquor per week  . Drug Use: No  . Sexual Activity: Not on file   Other Topics Concern  . Not on file  Social History Narrative  . No narrative on file    Review of Systems: CONSTITUTIONAL- No Fever SKIN- No Rash, or colour changes. RESPIRATORY- No Cough or SOB. CARDIAC- No Palpitations or chest pain. URINARY- No Frequency, urgency, straining or dysuria.  Physical Exam: Blood pressure 205/123, pulse 91, temperature 98.2 F (36.8 C), temperature source Oral, resp. rate 19, SpO2 96 %. GENERAL- alert, co-operative, appears as stated age, in moderate painful distress from abdominal pain. HEENT- Atraumatic, normocephalic, Pupils constricted, neck supple. CARDIAC- Regular,  no murmurs, rubs or gallops. RESP- Moving equal volumes of air, and clear to auscultation bilaterally, no wheezes or crackles. ABDOMEN- Soft, obese, tenderness right upper  quadrant worse in epigastric region, minimal guarding, no rebound, bowel sounds reduced. NEURO- No obvious Cr N abnormality,  Gait- Normal. EXTREMITIES- pulse 2+, symmetric, no pedal edema, but has chronic venous stasis changes on exam- bilat SKIN- Warm, dry, No rash PSYCH- Normal mood and affect, appropriate thought content and speech.  Lab results: Basic Metabolic Panel:  Recent Labs  88/41/66 1015  NA 135  K 3.9  CL 96*  CO2 27  GLUCOSE 203*  BUN 10  CREATININE 1.22  CALCIUM 9.1   Liver Function Tests:  Recent Labs  05/11/15 1015  AST 254*  ALT 181*  ALKPHOS 115  BILITOT 2.4*  PROT 7.5  ALBUMIN 3.9    Recent Labs  05/11/15 1015  LIPASE 172*   CBC:  Recent Labs  05/11/15 1015  WBC 7.0  NEUTROABS 5.2  HGB 17.1*  HCT 50.0  MCV 98.8  PLT 197   Cardiac Enzymes:  Recent Labs  05/11/15 1015  TROPONINI 0.04*   Urinalysis:  Recent Labs  05/11/15 1142  COLORURINE AMBER*  LABSPEC 1.017  PHURINE 8.5*  GLUCOSEU NEGATIVE  HGBUR NEGATIVE  BILIRUBINUR NEGATIVE  KETONESUR NEGATIVE  PROTEINUR 30*  UROBILINOGEN 1.0  NITRITE NEGATIVE  LEUKOCYTESUR NEGATIVE   Imaging results:  US Abdomen Limited  05/11/2015   CLINICAL DATA:  Epigastric pain  EXAM: US ABDOMEN LIMITED - RIGHT UPPER QUADRANT  COMPARISON:  MRCP 04/02/10  FINDINGS: Gallbladder:  Contracted gallbladder with multiple gallstones. There is mild thickening of gallbladder wall up to 3.2 mm. No sonographic Murphy's sign.  Common bile duct:  Diameter: There is distended CBD up to 10.8 mm. Probable stone within CBD measures at least 1.2 cm. Further correlation with ERCP or MRCP could be performed.  Liver:  There is diffuse increased echogenicity of the liver. Mild intrahepatic biliary ductal dilatation. No focal hepatic mass.  IMPRESSION: Contracted gallbladder. Multiple gallstones are noted within gallbladder. There is CBD dilatation up to 10.8 mm. Probable stone within CBD measures at least 1.2 cm.  Diffuse increased echogenicity of the liver consistent with fatty infiltration. Mild intrahepatic biliary ductal dilatation.  These results were called by telephone at the time of interpretation on 05/11/2015 at 12:08 pm to Dr. Gerhard Munch , who verbally acknowledged these results.   Electronically Signed   By: Natasha Mead M.D.   On: 05/11/2015 12:08   Other results: EKG: Rate 99bpm, regular, sinus rhythm, PR interval- 168, Normal axis, slight J point elevation without significant ST or T wave abnormality, insignificant Q waves in lateral leads- V5, V6, QTc- 484.    Assessment & Plan by Problem: Principal Problem:   Choledocholithiasis Active Problems:   Hypertension   Low testosterone  Choledocholitiasis- Likely cause of pts abdominal pain, with LFT elevation, AST-254, ALT- 181, though ALP is normal 115, Bili is elevated- 2.4, confirmed  on Ultra sound.  No features at this time suggestive of Cholangitis- No fevers, No WBC. Pt likely dehydrated also- Hgb elevated and with AKI. Liver enzymes elevated in 2011, also with elevated ALP, had workup  Which included- neg Hepatitis panel, neg ceruloplasmin, Neg Anti-smooth muscle,weakly positive Anti-nuclear Ab titer, mitochondrial Ab.  - Admit to med surg - Surg and GI consulted, recs appreciated. - NPO for now, pending evaluation and plan by GI and Surg - IVF N/s at 125cc/hr for  1 day - Cmet am - CBC am - IV dilaudid 1mg  Q2H  HTN- per records from [facility], Bp better controlled on Lorsatan- HCTZ- 100-25mg  daily. Last took his med last night. Bp elevated in the Ed due to abdominal pain- 219/107. Given 10mg h hydralazine in the ED, without response. - Hold home BP meds for now, as Pt has AKI and is dehydrated. - Will start Coreg 12.5mg  BID for now in the interim and switch to home meds on discharge - IVF n/s - Labetalol 10mg  Once, if no response will give IV hydralazine 20mg . - Check Cr am  Elevated Troponin- 0.04. Without chest pain, or EKG  abnormalities - Recheck trops X1   Increased Hgb- likley due to dehydration. As levels have been normal, Checked 2ce this year, per records from [facility] and were WNL- 15.2- January and 16.8 June. Also consider use of testosterone, which patient has been using for about 2 years now. - Recheck CBC am - If Hgb persistently elevated, consider hemochromatosis and therefore transferin saturation, serum iron and ferritin- ( Likely elevated in acute illness), as pt ppears to have tow testosterone which could suggest testicular involvement/atrophy.   Dispo: Disposition is deferred at this time, awaiting improvement of current medical problems.   The patient does have a current PCP (No primary care provider on file.) and does need an [facility] hospital follow-up appointment after discharge.  The patient does not know have transportation limitations that hinder transportation to clinic appointments.  Signed: Onnie Boer, MD 05/11/2015, 3:10 PM

## 2015-05-11 NOTE — Progress Notes (Signed)
[facility] for [facility] [phone]  Spoke to patient regarding primary care resources and the [employer] orange card. Orange card application provided and explained. Pt instructed to contact me once discharged for an appointment to obtain the orange card and to establish primary care. My contact information also given for any future questions or concerns. No other [facility] needs identified at this time.

## 2015-05-11 NOTE — Consult Note (Signed)
Bruce Alvarez 09/01/1971  [id number].   Primary Care MD: Dr. Will Bonnet Requesting MD: Dr. Carmin Muskrat Chief Complaint/Reason for Consult: cholelithiasis/choledocholithiasis HPI: This is a 44 yo wm who is obese with HTN who was admitted in 2011 with a TB of 12.6 and evaluated at that time for autoimmune hepatitis as his MRCP was negative for CBD stones.  His work up was ultimately negative and no pursuit of cholecystectomy was completed.    He woke up last night at 0200am with severe epigastric abdominal pain.  He denies nausea, but admits to emesis secondary to self-induction.  He felt he needed to throw up to reduce the pressure he was having.  He denies diarrhea, fevers, or chills.  He was brought to the ED for evaluation secondary to severe pain.  He was noted to have cholelithiasis and choledocholithiasis on his [place].  His WBC is normal, but TB is 2.4 and his transaminases are elevated as well.  His lipase is 173.  We have been asked to evaluate the patient for further recommendations.  ROS : Please see HPI, otherwise negative  History reviewed. No pertinent family history.  Past Medical History  Diagnosis Date  . Hypertension   . Low testosterone     History reviewed. No pertinent past surgical history.  Social History:  reports that he has been smoking Cigars.  He does not have any smokeless tobacco history on file. He reports that he drinks about 8.4 oz of alcohol per week. He reports that he does not use illicit drugs.  Allergies:  Allergies  Allergen Reactions  . Benzocaine Swelling  . Other     Pt reports he is allergic to -caines     (Not in a hospital admission)  Blood pressure 216/138, pulse 97, temperature 98.2 F (36.8 C), temperature source Oral, resp. rate 16, SpO2 96 %. Physical Exam: General: pleasant, obese white male who is laying in bed in NAD HEENT: head is normocephalic, atraumatic.  Sclera are noninjected.  PERRL.  Ears and nose without any masses or  lesions.  Mouth is pink and moist Heart: regular, rate, and rhythm.  Normal s1,s2. No obvious murmurs, gallops, or rubs noted.  Palpable radial and pedal pulses bilaterally Lungs: CTAB, no wheezes, rhonchi, or rales noted.  Respiratory effort nonlabored Abd: soft, tender greatest in epigastrium, obese, ND, +BS, no masses, hernias, or organomegaly MS: all 4 extremities are symmetrical with no cyanosis, clubbing, or edema. Chronic venous stasis changes Skin: warm and dry with no masses, lesions, or rashes Psych: A&Ox3 with an appropriate affect.    Results for orders placed or performed during the hospital encounter of 05/11/15 (from the past 48 hour(s))  CBC with Differential     Status: Abnormal   Collection Time: 05/11/15 10:15 AM  Result Value Ref Range   WBC 7.0 4.0 - 10.5 K/uL   RBC 5.06 4.22 - 5.81 MIL/uL   Hemoglobin 17.1 (H) 13.0 - 17.0 g/dL   HCT 50.0 39.0 - 52.0 %   MCV 98.8 78.0 - 100.0 fL   MCH 33.8 26.0 - 34.0 pg   MCHC 34.2 30.0 - 36.0 g/dL   RDW 14.3 11.5 - 15.5 %   Platelets 197 150 - 400 K/uL   Neutrophils Relative % 74 43 - 77 %   Neutro Abs 5.2 1.7 - 7.7 K/uL   Lymphocytes Relative 17 12 - 46 %   Lymphs Abs 1.2 0.7 - 4.0 K/uL   Monocytes Relative 8 3 - 12 %  Monocytes Absolute 0.6 0.1 - 1.0 K/uL   Eosinophils Relative 1 0 - 5 %   Eosinophils Absolute 0.0 0.0 - 0.7 K/uL   Basophils Relative 0 0 - 1 %   Basophils Absolute 0.0 0.0 - 0.1 K/uL  Comprehensive metabolic panel     Status: Abnormal   Collection Time: 05/11/15 10:15 AM  Result Value Ref Range   Sodium 135 135 - 145 mmol/L   Potassium 3.9 3.5 - 5.1 mmol/L   Chloride 96 (L) 101 - 111 mmol/L   CO2 27 22 - 32 mmol/L   Glucose, Bld 203 (H) 65 - 99 mg/dL   BUN 10 6 - 20 mg/dL   Creatinine, Ser 1.22 0.61 - 1.24 mg/dL   Calcium 9.1 8.9 - 10.3 mg/dL   Total Protein 7.5 6.5 - 8.1 g/dL   Albumin 3.9 3.5 - 5.0 g/dL   AST 254 (H) 15 - 41 U/L   ALT 181 (H) 17 - 63 U/L   Alkaline Phosphatase 115 38 - 126 U/L    Total Bilirubin 2.4 (H) 0.3 - 1.2 mg/dL   GFR calc non Af Amer >60 >60 mL/min   GFR calc Af Amer >60 >60 mL/min    Comment: (NOTE) The eGFR has been calculated using the CKD EPI equation. This calculation has not been validated in all clinical situations. eGFR's persistently <60 mL/min signify possible Chronic Kidney Disease.    Anion gap 12 5 - 15  Lipase, blood     Status: Abnormal   Collection Time: 05/11/15 10:15 AM  Result Value Ref Range   Lipase 172 (H) 22 - 51 U/L  Troponin I     Status: Abnormal   Collection Time: 05/11/15 10:15 AM  Result Value Ref Range   Troponin I 0.04 (H) <0.031 ng/mL    Comment:        PERSISTENTLY INCREASED TROPONIN VALUES IN THE RANGE OF 0.04-0.49 ng/mL CAN BE SEEN IN:       -UNSTABLE ANGINA       -CONGESTIVE HEART FAILURE       -MYOCARDITIS       -CHEST TRAUMA       -ARRYHTHMIAS       -LATE PRESENTING MYOCARDIAL INFARCTION       -COPD   CLINICAL FOLLOW-UP RECOMMENDED.   Urinalysis, Routine w reflex microscopic (not at [facility])     Status: Abnormal   Collection Time: 05/11/15 11:42 AM  Result Value Ref Range   Color, Urine AMBER (A) YELLOW    Comment: BIOCHEMICALS MAY BE AFFECTED BY COLOR   APPearance CLOUDY (A) CLEAR   Specific Gravity, Urine 1.017 1.005 - 1.030   pH 8.5 (H) 5.0 - 8.0   Glucose, UA NEGATIVE NEGATIVE mg/dL   Hgb urine dipstick NEGATIVE NEGATIVE   Bilirubin Urine NEGATIVE NEGATIVE   Ketones, ur NEGATIVE NEGATIVE mg/dL   Protein, ur 30 (A) NEGATIVE mg/dL   Urobilinogen, UA 1.0 0.0 - 1.0 mg/dL   Nitrite NEGATIVE NEGATIVE   Leukocytes, UA NEGATIVE NEGATIVE  Urine microscopic-add on     Status: None   Collection Time: 05/11/15 11:42 AM  Result Value Ref Range   Squamous Epithelial / LPF RARE RARE   WBC, UA 0-2 <3 WBC/hpf   Urine-Other AMORPHOUS URATES/PHOSPHATES    US Abdomen Limited  05/11/2015   CLINICAL DATA:  Epigastric pain  EXAM: US ABDOMEN LIMITED - RIGHT UPPER QUADRANT  COMPARISON:  MRCP 04/02/10  FINDINGS:  Gallbladder:  Contracted gallbladder with multiple gallstones. There  is mild thickening of gallbladder wall up to 3.2 mm. No sonographic Murphy's sign.  Common bile duct:  Diameter: There is distended CBD up to 10.8 mm. Probable stone within CBD measures at least 1.2 cm. Further correlation with ERCP or MRCP could be performed.  Liver:  There is diffuse increased echogenicity of the liver. Mild intrahepatic biliary ductal dilatation. No focal hepatic mass.  IMPRESSION: Contracted gallbladder. Multiple gallstones are noted within gallbladder. There is CBD dilatation up to 10.8 mm. Probable stone within CBD measures at least 1.2 cm. Diffuse increased echogenicity of the liver consistent with fatty infiltration. Mild intrahepatic biliary ductal dilatation.  These results were called by telephone at the time of interpretation on 05/11/2015 at 12:08 pm to Dr. Carmin Muskrat , who verbally acknowledged these results.   Electronically Signed   By: Lahoma Crocker M.D.   On: 05/11/2015 12:08       Assessment/Plan 1. Cholelithiasis/choledocholithiasis -patient is going to be admitted by the medicine service and GI has been consulted for evaluation for ERCP given he has a 1.2cm stone in his CBD.  Once his evaluation for this has been complete, then we will plan for a lap chole.  We will follow in the meantime.  Natalea Sutliff E 05/11/2015, 1:58 PM Pager: [phone]

## 2015-05-12 ENCOUNTER — Inpatient Hospital Stay (HOSPITAL_COMMUNITY): Payer: Medicaid Other

## 2015-05-12 ENCOUNTER — Encounter (HOSPITAL_COMMUNITY)
Admission: EM | Disposition: A | Discharge status: Home / Self Care | Payer: Self-pay | Source: Home / Self Care | Attending: Emergency Medicine

## 2015-05-12 ENCOUNTER — Inpatient Hospital Stay (HOSPITAL_COMMUNITY): Payer: Medicaid Other | Admitting: Anesthesiology

## 2015-05-12 ENCOUNTER — Encounter (HOSPITAL_COMMUNITY): Payer: Self-pay | Admitting: Certified Registered"

## 2015-05-12 DIAGNOSIS — R7989 Other specified abnormal findings of blood chemistry: Secondary | ICD-10-CM

## 2015-05-12 DIAGNOSIS — N179 Acute kidney failure, unspecified: Secondary | ICD-10-CM

## 2015-05-12 DIAGNOSIS — K8070 Calculus of gallbladder and bile duct without cholecystitis without obstruction: Secondary | ICD-10-CM | POA: Diagnosis not present

## 2015-05-12 DIAGNOSIS — R778 Other specified abnormalities of plasma proteins: Secondary | ICD-10-CM | POA: Diagnosis present

## 2015-05-12 DIAGNOSIS — I272 Other secondary pulmonary hypertension: Secondary | ICD-10-CM | POA: Diagnosis not present

## 2015-05-12 DIAGNOSIS — R739 Hyperglycemia, unspecified: Secondary | ICD-10-CM

## 2015-05-12 DIAGNOSIS — G4700 Insomnia, unspecified: Secondary | ICD-10-CM

## 2015-05-12 DIAGNOSIS — I10 Essential (primary) hypertension: Secondary | ICD-10-CM | POA: Diagnosis not present

## 2015-05-12 DIAGNOSIS — I420 Dilated cardiomyopathy: Secondary | ICD-10-CM | POA: Diagnosis not present

## 2015-05-12 DIAGNOSIS — K8050 Calculus of bile duct without cholangitis or cholecystitis without obstruction: Secondary | ICD-10-CM

## 2015-05-12 HISTORY — PX: ERCP: SHX5425

## 2015-05-12 LAB — COMPREHENSIVE METABOLIC PANEL
ALBUMIN: 3.8 g/dL (ref 3.5–5.0)
ALT: 400 U/L — ABNORMAL HIGH (ref 17–63)
ANION GAP: 11 (ref 5–15)
AST: 428 U/L — ABNORMAL HIGH (ref 15–41)
Alkaline Phosphatase: 143 U/L — ABNORMAL HIGH (ref 38–126)
BUN: 9 mg/dL (ref 6–20)
CHLORIDE: 95 mmol/L — AB (ref 101–111)
CO2: 28 mmol/L (ref 22–32)
CREATININE: 1.37 mg/dL — AB (ref 0.61–1.24)
Calcium: 8.6 mg/dL — ABNORMAL LOW (ref 8.9–10.3)
GFR calc non Af Amer: >60 mL/min (ref >60)
Glucose, Bld: 234 mg/dL — ABNORMAL HIGH (ref 65–99)
Potassium: 4.9 mmol/L (ref 3.5–5.1)
Sodium: 134 mmol/L — ABNORMAL LOW (ref 135–145)
Total Bilirubin: 4.8 mg/dL — ABNORMAL HIGH (ref 0.3–1.2)
Total Protein: 7.4 g/dL (ref 6.5–8.1)

## 2015-05-12 LAB — CBC
HCT: 50.1 % (ref 39.0–52.0)
HEMOGLOBIN: 17 g/dL (ref 13.0–17.0)
MCH: 34.8 pg — ABNORMAL HIGH (ref 26.0–34.0)
MCHC: 33.9 g/dL (ref 30.0–36.0)
MCV: 102.5 fL — ABNORMAL HIGH (ref 78.0–100.0)
PLATELETS: 294 10*3/uL (ref 150–400)
RBC: 4.89 MIL/uL (ref 4.22–5.81)
RDW: 15 % (ref 11.5–15.5)
WBC: 12 10*3/uL — ABNORMAL HIGH (ref 4.0–10.5)

## 2015-05-12 LAB — LIPASE, BLOOD: Lipase: 81 U/L — ABNORMAL HIGH (ref 22–51)

## 2015-05-12 LAB — TROPONIN I
TROPONIN I: 0.18 ng/mL — AB (ref <0.031)
Troponin I: 0.19 ng/mL — ABNORMAL HIGH (ref <0.031)

## 2015-05-12 SURGERY — ERCP, WITH INTERVENTION IF INDICATED
Anesthesia: General

## 2015-05-12 MED ORDER — HYDROMORPHONE HCL 1 MG/ML IJ SOLN: 0.2500 mg | INTRAMUSCULAR | Status: DC | PRN

## 2015-05-12 MED ORDER — CIPROFLOXACIN IN D5W 400 MG/200ML IV SOLN
INTRAVENOUS | Status: AC
Start: 2015-05-12 — End: 2015-05-12
  Filled 2015-05-12: qty 200

## 2015-05-12 MED ORDER — SODIUM CHLORIDE 0.9 % IV SOLN: INTRAVENOUS | Status: DC

## 2015-05-12 MED ORDER — EPHEDRINE SULFATE 50 MG/ML IJ SOLN
INTRAMUSCULAR | Status: DC | PRN
  Administered 2015-05-12: 15 mg via INTRAVENOUS

## 2015-05-12 MED ORDER — PHENYLEPHRINE HCL 10 MG/ML IJ SOLN
10.0000 mg | INTRAMUSCULAR | Status: DC | PRN
  Administered 2015-05-12: 50 ug/min via INTRAVENOUS

## 2015-05-12 MED ORDER — LACTATED RINGERS IV SOLN
Freq: Once | INTRAVENOUS | Status: AC
  Administered 2015-05-12 (×2): via INTRAVENOUS

## 2015-05-12 MED ORDER — CETYLPYRIDINIUM CHLORIDE 0.05 % MT LIQD
7.0000 mL | Freq: Two times a day (BID) | OROMUCOSAL | Status: DC
  Administered 2015-05-12 – 2015-05-17 (×6): 7 mL via OROMUCOSAL

## 2015-05-12 MED ORDER — FENTANYL CITRATE (PF) 250 MCG/5ML IJ SOLN
INTRAMUSCULAR | Status: DC | PRN
  Administered 2015-05-12: 100 ug via INTRAVENOUS

## 2015-05-12 MED ORDER — CIPROFLOXACIN IN D5W 400 MG/200ML IV SOLN: 400.0000 mg | Freq: Once | INTRAVENOUS | Status: DC

## 2015-05-12 MED ORDER — PHENYLEPHRINE HCL 10 MG/ML IJ SOLN
INTRAMUSCULAR | Status: DC | PRN
  Administered 2015-05-12: 120 ug via INTRAVENOUS
  Administered 2015-05-12: 80 ug via INTRAVENOUS
  Administered 2015-05-12: 160 ug via INTRAVENOUS

## 2015-05-12 MED ORDER — ONDANSETRON HCL 4 MG/2ML IJ SOLN
INTRAMUSCULAR | Status: DC | PRN
  Administered 2015-05-12: 4 mg via INTRAVENOUS

## 2015-05-12 MED ORDER — GLUCAGON HCL RDNA (DIAGNOSTIC) 1 MG IJ SOLR
INTRAMUSCULAR | Status: DC | PRN
  Administered 2015-05-12: 1 mg via INTRAVENOUS

## 2015-05-12 MED ORDER — SUCCINYLCHOLINE CHLORIDE 20 MG/ML IJ SOLN
INTRAMUSCULAR | Status: DC | PRN
  Administered 2015-05-12: 100 mg via INTRAVENOUS

## 2015-05-12 MED ORDER — IOHEXOL 350 MG/ML SOLN
INTRAVENOUS | Status: DC | PRN
  Administered 2015-05-12: 30 mL

## 2015-05-12 MED ORDER — PROPOFOL 10 MG/ML IV BOLUS
INTRAVENOUS | Status: DC | PRN
  Administered 2015-05-12: 150 mg via INTRAVENOUS

## 2015-05-12 MED ORDER — CHLORHEXIDINE GLUCONATE 0.12 % MT SOLN
15.0000 mL | Freq: Two times a day (BID) | OROMUCOSAL | Status: DC
  Administered 2015-05-12 – 2015-05-17 (×9): 15 mL via OROMUCOSAL
  Filled 2015-05-12 (×10): qty 15

## 2015-05-12 MED ORDER — MIDAZOLAM HCL 5 MG/5ML IJ SOLN
INTRAMUSCULAR | Status: DC | PRN
  Administered 2015-05-12: 1 mg via INTRAVENOUS

## 2015-05-12 NOTE — Consult Note (Signed)
CARDIOLOGY CONSULT NOTE   Patient ID: Bruce Alvarez MRN: [id number], DOB/AGE: 1970-12-08   Admit date: 05/11/2015 Date of Consult: 05/12/2015   Primary Physician: Dorothey Baseman, MD Primary Cardiologist: new  Pt. Profile  morbidly obese 44 year old male with past medical history of hypertension, depression, hypoaldosteronism, anxiety, obesity and likely obstructive sleep apnea present with abdominal pain. S/p ERCP and pending cholecystectomy tomorrow. Cardiology consulted for preop clearance and elevated of 0.04 and 0.06 in the setting of Cr 1.3 and SBP 200  Problem List  Past Medical History  Diagnosis Date  . Hypertension   . Low testosterone   . Sleep apnea     "not technically dx'd by a physician" (05/11/2015)  . Depression     "took effexor til ~ 02/2015" (05/11/2015)    Past Surgical History  Procedure Laterality Date  . No past surgeries       Allergies  Allergies  Allergen Reactions  . Benzocaine Swelling  . Other     Pt reports he is allergic to -caines    HPI   The patient is a morbidly obese 44 year old male with past medical history of hypertension, depression, hypoaldosteronism, anxiety, obesity and likely obstructive sleep apnea. He does not have prior cardiac history. According to past records, patient had biliary colic symptom back in 2011, he declined cholecystectomy at that time, and passed bile duct stone. He presented to [facility] on 05/11/2015 with several days of acute epigastric pain in the right upper quadrant abdominal pain. While in the ED, abdominal ultrasound shows dilated bile duct with 1.2 cm stone in the common bile duct. LFT shows elevated transaminase. He has been having nausea and decreased by mouth intake. He attempted to force vomiting yesterday, but otherwise has not had significant vomiting. He denies any fever or chill. He takes losartan hydrochlorothiazide at home. On arrival his creatinine was elevated at 1.3. Both GI and surgery has  been consulted. Patient is undergoing ERCP today and the planning for cholecystectomy tomorrow given recurrent choledocholithiasis.  Overnight, patient's blood pressure increased to over 200 due to abdominal pain. His troponin increased to 0.06. Cardiology has been consulted for preoperative clearance prior to tomorrow's cholecystectomy. According to the patient, he has been very active at home despite large size. 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 He actively walking his yard. The most strenuous activity he has done in the last 2 weeks was to push lawn mower last Sunday to cleaning his backyard. He denies any recent exertional chest pain, shortness breath, lower extremity edema, orthopnea or paroxysmal nocturnal dyspnea.    Inpatient Medications  . antiseptic oral rinse  7 mL Mouth Rinse q12n4p  . carvedilol  12.5 mg Oral BID WC  . chlorhexidine  15 mL Mouth Rinse BID  . fentaNYL (SUBLIMAZE) injection  50 mcg Intravenous Once  . heparin  5,000 Units Subcutaneous 3 times per day    Family History Family History  Problem Relation Age of Onset  . Heart attack Father 52    died from the "widow maker" at age 77  . Heart attack Paternal Grandfather 97  . Breast cancer Mother      Social History History   Social History  . Marital Status: Married    Spouse Name: N/A  . Number of Children: N/A  . Years of Education: N/A   Occupational History  . Therapist, occupational   Social History  Main Topics  . Smoking status: Current Every Day Smoker -- 10 years    Types: Cigars  . Smokeless tobacco: Never Used     Comment: occasional smoker cigar, but never cigarette  . Alcohol Use: 12.6 oz/week    21 Shots of liquor per week     Comment: 05/11/2015 "0-5 shots of boubon/day"  . Drug Use: No  . Sexual Activity: Yes   Other Topics Concern  . Not on file   Social History Narrative     Review of Systems  General:  No chills, fever, night sweats or  weight changes.  Cardiovascular:  No chest pain, dyspnea on exertion, edema, orthopnea, palpitations, paroxysmal nocturnal dyspnea. Dermatological: No rash, lesions/masses Respiratory: No cough, dyspnea Urologic: No hematuria, dysuria Abdominal:   No vomiting, diarrhea, bright red blood per rectum, melena, or hematemesis +nausea, abdominal pain Neurologic:  No visual changes, wkns, changes in mental status. All other systems reviewed and are otherwise negative except as noted above.  Physical Exam  Blood pressure 112/61, pulse 96, temperature 98.9 F (37.2 C), temperature source Oral, resp. rate 20, height  (1.626 m), weight 270 lb (122.471 kg), SpO2 92 %.  General: Pleasant, NAD Psych: Normal affect. Neuro: Alert and oriented X 3. Moves all extremities spontaneously. HEENT: Normal  Neck: Supple without bruits or JVD. Lungs:  Resp regular and unlabored, CTA.  Heart: RRR no s3, s4, or murmurs. Abdomen: Soft, non-tender, non-distended, BS + x 4.  Extremities: No clubbing, cyanosis or edema. DP/PT/Radials 2+ and equal bilaterally.  Labs   Recent Labs  05/11/15 1015 05/11/15 2143  TROPONINI 0.04* 0.06*   Lab Results  Component Value Date   WBC 12.0* 05/12/2015   HGB 17.0 05/12/2015   HCT 50.1 05/12/2015   MCV 102.5* 05/12/2015   PLT 294 05/12/2015     Recent Labs Lab 05/12/15 0351  NA 134*  K 4.9  CL 95*  CO2 28  BUN 9  CREATININE 1.37*  CALCIUM 8.6*  PROT 7.4  BILITOT 4.8*  ALKPHOS 143*  ALT 400*  AST 428*  GLUCOSE 234*   No results found for: CHOL, HDL, LDLCALC, TRIG No results found for: DDIMER  Radiology/Studies  US Abdomen Limited  05/11/2015   CLINICAL DATA:  Epigastric pain  EXAM: US ABDOMEN LIMITED - RIGHT UPPER QUADRANT  COMPARISON:  MRCP 04/02/10  FINDINGS: Gallbladder:  Contracted gallbladder with multiple gallstones. There is mild thickening of gallbladder wall up to 3.2 mm. No sonographic Murphy's sign.  Common bile duct:  Diameter: There is  distended CBD up to 10.8 mm. Probable stone within CBD measures at least 1.2 cm. Further correlation with ERCP or MRCP could be performed.  Liver:  There is diffuse increased echogenicity of the liver. Mild intrahepatic biliary ductal dilatation. No focal hepatic mass.  IMPRESSION: Contracted gallbladder. Multiple gallstones are noted within gallbladder. There is CBD dilatation up to 10.8 mm. Probable stone within CBD measures at least 1.2 cm. Diffuse increased echogenicity of the liver consistent with fatty infiltration. Mild intrahepatic biliary ductal dilatation.  These results were called by telephone at the time of interpretation on 05/11/2015 at 12:08 pm to Dr. Gerhard Munch , who verbally acknowledged these results.   Electronically Signed   By: Natasha Mead M.D.   On: 05/11/2015 12:08   Dg Ercp With Sphincterotomy  05/12/2015   CLINICAL DATA:  44 year old male with choledocholithiasis  EXAM: ERCP  TECHNIQUE: Multiple spot images obtained with the fluoroscopic device and submitted for interpretation  post-procedure.  FLUOROSCOPY TIME:  7 minutes 17 seconds reported. Please see GI operative note for further detail.  COMPARISON:  Abdominal ultrasound 05/11/2015  FINDINGS: Two intraoperative spot images demonstrate a flexible endoscope in the descending duodenum and cannulation of the common bile duct. There is mild biliary ductal dilatation. The cystic duct is patent. Multiple filling defects are present within the gallbladder consistent with cholelithiasis. The second image demonstrates a sphincterotomy device in place.  IMPRESSION: ERCP with sphincterotomy as above.  Cholelithiasis with patent cystic duct.  These images were submitted for radiologic interpretation only. Please see the procedural report for the amount of contrast and the fluoroscopy time utilized.   Electronically Signed   By: Malachy Moan M.D.   On: 05/12/2015 15:50    ECG  Sinus tachycardia without significant ST-T wave changes.  Some motion artifact make ST segment appears to be depressed in the inferior lead, however this is clearly related to motion artifact.  ASSESSMENT AND PLAN  1. Preop clearance for cholecystectomy  - he has good functional status without any exertional symptom. He was able to push the lawnmower last Sunday. Although does have FHx, however no recent symptom to suggest angina in this young pt  - Elevated troponin likely demand ischemia in the setting of abdomen elevated blood pressure, choledocholithiasis, and creatinine of 1.3. No further inpatient cardiac workup needed in this case. Above plan per discussion with MD.  2. Elevated trop: demand ischemia in the setting of elevated Cr, choledocholithiasis, BP 200  3. Hypertension 4. Depression 5. Hypoaldosteronism 6. Anxiety 7. obesity  8. Likely obstructive sleep apnea: need outpatient sleep study  Ramond Dial, PA-C 05/12/2015, 5:09 PM Patient seen and examined and history reviewed. Agree with above findings and plan. Patient has no symptoms of angina or CHF. Risk factors include HTN and family history of early CAD. Ecg is not acute. Troponin slight elevation is due to acute medical illness with severe HTN on admission and acute renal insufficiency. I think he is low risk from a cardiac standpoint and can safely proceed with cholecystectomy. Post op need to focus on optimal BP control, regular aerobic exercise, weight loss, and heart healthy diet. Needs lipid panel to assess risk further. Please call with other questions.  Zakariya [place], MDFACC 05/12/2015 5:40 PM

## 2015-05-12 NOTE — Progress Notes (Signed)
Inpatient Diabetes Program Recommendations  AACE/ADA: New Consensus Statement on Inpatient Glycemic Control (2013)  Target Ranges:  Prepandial:   less than 140 mg/dL      Peak postprandial:   less than 180 mg/dL (1-2 hours)      Critically ill patients:  140 - 180 mg/dL   Results for Bruce Alvarez, Bruce Alvarez (MRN [id number]) as of 05/12/2015 13:39  Ref. Range 05/11/2015 10:15 05/12/2015 03:51  Glucose Latest Ref Range: 65-99 mg/dL 742 (H) 595 (H)    Diabetes history: No Outpatient Diabetes medications: NA Current orders for Inpatient glycemic control: None  Inpatient Diabetes Program Recommendations Correction (SSI): Lab glucose 234 mg/dl today at 63:87 am. While inpatient , please consider ordering CBGs with Novolog correction ACHS. HgbA1C: Please consider ordering an A1C to evaluate glycemic control over the past 2-3 months.  Thanks, Orlando Penner, RN, MSN, CCRN, CDE Diabetes Coordinator Inpatient Diabetes Program [phone] (Team Pager from 8am to 5pm) [phone] ([facility] office) [phone] [facility] office) [phone] [facility] office)

## 2015-05-12 NOTE — Progress Notes (Signed)
Patient ID: Bruce Alvarez, male   DOB: 06/16/1971, 44 y.o.   MRN: [id number]     [facility]      [address]   [place], [place] [address]    Phone: [phone] FAX: [phone]     Subjective: Pain is much better. LFTs increased.  T bili up to 4.8.   Denies cp, sob, doe.   Objective:  Vital signs:  Filed Vitals:   05/11/15 2056 05/11/15 2100 05/11/15 2308 05/12/15 0544  BP: 201/117 201/117 184/84 147/97  Pulse:  107 104 103  Temp:    98.2 F (36.8 C)  TempSrc:    Oral  Resp:    24  Height:      Weight:      SpO2:   94% 97%    Last BM Date: 05/11/15  Intake/Output   Yesterday:  08/03 0701 - 08/04 0700 In: 1200 [I.V.:1200] Out: -  This shift:    I/O last 3 completed shifts: In: 1200 [I.V.:1200] Out: -     Physical Exam: General: Pt awake/alert/oriented x4 in no acute distress Abdomen: Soft.  Nondistended.  Nontender.  No evidence of peritonitis.  No incarcerated hernias.    Problem List:   Principal Problem:   Choledocholithiasis Active Problems:   Hypertension   Low testosterone    Results:   Labs: Results for orders placed or performed during the hospital encounter of 05/11/15 (from the past 48 hour(s))  CBC with Differential     Status: Abnormal   Collection Time: 05/11/15 10:15 AM  Result Value Ref Range   WBC 7.0 4.0 - 10.5 K/uL   RBC 5.06 4.22 - 5.81 MIL/uL   Hemoglobin 17.1 (H) 13.0 - 17.0 g/dL   HCT 50.0 39.0 - 52.0 %   MCV 98.8 78.0 - 100.0 fL   MCH 33.8 26.0 - 34.0 pg   MCHC 34.2 30.0 - 36.0 g/dL   RDW 14.3 11.5 - 15.5 %   Platelets 197 150 - 400 K/uL   Neutrophils Relative % 74 43 - 77 %   Neutro Abs 5.2 1.7 - 7.7 K/uL   Lymphocytes Relative 17 12 - 46 %   Lymphs Abs 1.2 0.7 - 4.0 K/uL   Monocytes Relative 8 3 - 12 %   Monocytes Absolute 0.6 0.1 - 1.0 K/uL   Eosinophils Relative 1 0 - 5 %   Eosinophils Absolute 0.0 0.0 - 0.7 K/uL   Basophils Relative 0 0 - 1 %   Basophils Absolute  0.0 0.0 - 0.1 K/uL  Comprehensive metabolic panel     Status: Abnormal   Collection Time: 05/11/15 10:15 AM  Result Value Ref Range   Sodium 135 135 - 145 mmol/L   Potassium 3.9 3.5 - 5.1 mmol/L   Chloride 96 (L) 101 - 111 mmol/L   CO2 27 22 - 32 mmol/L   Glucose, Bld 203 (H) 65 - 99 mg/dL   BUN 10 6 - 20 mg/dL   Creatinine, Ser 1.22 0.61 - 1.24 mg/dL   Calcium 9.1 8.9 - 10.3 mg/dL   Total Protein 7.5 6.5 - 8.1 g/dL   Albumin 3.9 3.5 - 5.0 g/dL   AST 254 (H) 15 - 41 U/L   ALT 181 (H) 17 - 63 U/L   Alkaline Phosphatase 115 38 - 126 U/L   Total Bilirubin 2.4 (H) 0.3 - 1.2 mg/dL   GFR calc non Af Amer >60 >60 mL/min   GFR calc Af Amer >60 >  60 mL/min    Comment: (NOTE) The eGFR has been calculated using the CKD EPI equation. This calculation has not been validated in all clinical situations. eGFR's persistently <60 mL/min signify possible Chronic Kidney Disease.    Anion gap 12 5 - 15  Lipase, blood     Status: Abnormal   Collection Time: 05/11/15 10:15 AM  Result Value Ref Range   Lipase 172 (H) 22 - 51 U/L  Troponin I     Status: Abnormal   Collection Time: 05/11/15 10:15 AM  Result Value Ref Range   Troponin I 0.04 (H) <0.031 ng/mL    Comment:        PERSISTENTLY INCREASED TROPONIN VALUES IN THE RANGE OF 0.04-0.49 ng/mL CAN BE SEEN IN:       -UNSTABLE ANGINA       -CONGESTIVE HEART FAILURE       -MYOCARDITIS       -CHEST TRAUMA       -ARRYHTHMIAS       -LATE PRESENTING MYOCARDIAL INFARCTION       -COPD   CLINICAL FOLLOW-UP RECOMMENDED.   Urinalysis, Routine w reflex microscopic (not at [facility])     Status: Abnormal   Collection Time: 05/11/15 11:42 AM  Result Value Ref Range   Color, Urine AMBER (A) YELLOW    Comment: BIOCHEMICALS MAY BE AFFECTED BY COLOR   APPearance CLOUDY (A) CLEAR   Specific Gravity, Urine 1.017 1.005 - 1.030   pH 8.5 (H) 5.0 - 8.0   Glucose, UA NEGATIVE NEGATIVE mg/dL   Hgb urine dipstick NEGATIVE NEGATIVE   Bilirubin Urine NEGATIVE NEGATIVE    Ketones, ur NEGATIVE NEGATIVE mg/dL   Protein, ur 30 (A) NEGATIVE mg/dL   Urobilinogen, UA 1.0 0.0 - 1.0 mg/dL   Nitrite NEGATIVE NEGATIVE   Leukocytes, UA NEGATIVE NEGATIVE  Urine microscopic-add on     Status: None   Collection Time: 05/11/15 11:42 AM  Result Value Ref Range   Squamous Epithelial / LPF RARE RARE   WBC, UA 0-2 <3 WBC/hpf   Urine-Other AMORPHOUS URATES/PHOSPHATES   Troponin I     Status: Abnormal   Collection Time: 05/11/15  9:43 PM  Result Value Ref Range   Troponin I 0.06 (H) <0.031 ng/mL    Comment:        PERSISTENTLY INCREASED TROPONIN VALUES IN THE RANGE OF 0.04-0.49 ng/mL CAN BE SEEN IN:       -UNSTABLE ANGINA       -CONGESTIVE HEART FAILURE       -MYOCARDITIS       -CHEST TRAUMA       -ARRYHTHMIAS       -LATE PRESENTING MYOCARDIAL INFARCTION       -COPD   CLINICAL FOLLOW-UP RECOMMENDED.   Comprehensive metabolic panel     Status: Abnormal   Collection Time: 05/12/15  3:51 AM  Result Value Ref Range   Sodium 134 (L) 135 - 145 mmol/L   Potassium 4.9 3.5 - 5.1 mmol/L    Comment: DELTA CHECK NOTED   Chloride 95 (L) 101 - 111 mmol/L   CO2 28 22 - 32 mmol/L   Glucose, Bld 234 (H) 65 - 99 mg/dL   BUN 9 6 - 20 mg/dL   Creatinine, Ser 1.37 (H) 0.61 - 1.24 mg/dL   Calcium 8.6 (L) 8.9 - 10.3 mg/dL   Total Protein 7.4 6.5 - 8.1 g/dL   Albumin 3.8 3.5 - 5.0 g/dL   AST 428 (H) 15 - 41  U/L   ALT 400 (H) 17 - 63 U/L   Alkaline Phosphatase 143 (H) 38 - 126 U/L   Total Bilirubin 4.8 (H) 0.3 - 1.2 mg/dL   GFR calc non Af Amer >60 >60 mL/min   GFR calc Af Amer >60 >60 mL/min    Comment: (NOTE) The eGFR has been calculated using the CKD EPI equation. This calculation has not been validated in all clinical situations. eGFR's persistently <60 mL/min signify possible Chronic Kidney Disease.    Anion gap 11 5 - 15  CBC     Status: Abnormal   Collection Time: 05/12/15  3:51 AM  Result Value Ref Range   WBC 12.0 (H) 4.0 - 10.5 K/uL    Comment: WHITE COUNT  CONFIRMED ON SMEAR   RBC 4.89 4.22 - 5.81 MIL/uL   Hemoglobin 17.0 13.0 - 17.0 g/dL   HCT 50.1 39.0 - 52.0 %   MCV 102.5 (H) 78.0 - 100.0 fL   MCH 34.8 (H) 26.0 - 34.0 pg   MCHC 33.9 30.0 - 36.0 g/dL   RDW 15.0 11.5 - 15.5 %   Platelets 294 150 - 400 K/uL    Comment: PLATELET COUNT CONFIRMED BY SMEAR    Imaging / Studies: Us Abdomen Limited  05/11/2015   CLINICAL DATA:  Epigastric pain  EXAM: US ABDOMEN LIMITED - RIGHT UPPER QUADRANT  COMPARISON:  MRCP 04/02/10  FINDINGS: Gallbladder:  Contracted gallbladder with multiple gallstones. There is mild thickening of gallbladder wall up to 3.2 mm. No sonographic Murphy's sign.  Common bile duct:  Diameter: There is distended CBD up to 10.8 mm. Probable stone within CBD measures at least 1.2 cm. Further correlation with ERCP or MRCP could be performed.  Liver:  There is diffuse increased echogenicity of the liver. Mild intrahepatic biliary ductal dilatation. No focal hepatic mass.  IMPRESSION: Contracted gallbladder. Multiple gallstones are noted within gallbladder. There is CBD dilatation up to 10.8 mm. Probable stone within CBD measures at least 1.2 cm. Diffuse increased echogenicity of the liver consistent with fatty infiltration. Mild intrahepatic biliary ductal dilatation.  These results were called by telephone at the time of interpretation on 05/11/2015 at 12:08 pm to Dr. ROBERT LOCKWOOD , who verbally acknowledged these results.   Electronically Signed   By: Liviu  Pop M.D.   On: 05/11/2015 12:08    Medications / Allergies:  Scheduled Meds: . antiseptic oral rinse  7 mL Mouth Rinse q12n4p  . carvedilol  12.5 mg Oral BID WC  . chlorhexidine  15 mL Mouth Rinse BID  . fentaNYL (SUBLIMAZE) injection  50 mcg Intravenous Once  . heparin  5,000 Units Subcutaneous 3 times per day   Continuous Infusions: . sodium chloride 125 mL/hr at 05/12/15 0444   PRN Meds:.hydrALAZINE, HYDROmorphone (DILAUDID) injection, ondansetron **OR** ondansetron (ZOFRAN)  IV  Antibiotics: Anti-infectives    None        Assessment/Plan Cholelithiasis/choledocholithiasis -ERCP, then cholecystectomy, likely tomorrow Indeterminate trops-we need cardiology clearance pre-operatively FEN-NPO after midnight Dispo-ERCP today  Charlotte Fidalgo, ANP-BC [facility] Pager [phone](7A-4:30P) For consults and floor pages call [phone](7A-4:30P)  05/12/2015 8:22 AM    

## 2015-05-12 NOTE — Anesthesia Preprocedure Evaluation (Addendum)
Anesthesia Evaluation  Patient identified by MRN, date of birth, ID band Patient awake    Reviewed: Allergy & Precautions, H&P , NPO status , Patient's Chart, lab work & pertinent test results  Airway Mallampati: III  TM Distance: >3 FB Neck ROM: Full    Dental no notable dental hx. (+) Teeth Intact, Dental Advisory Given   Pulmonary sleep apnea , Current Smoker,  breath sounds clear to auscultation  Pulmonary exam normal       Cardiovascular hypertension, Pt. on medications negative cardio ROS  Rhythm:Regular Rate:Normal     Neuro/Psych negative neurological ROS  negative psych ROS   GI/Hepatic negative GI ROS, Neg liver ROS,   Endo/Other  Morbid obesity  Renal/GU negative Renal ROS  negative genitourinary   Musculoskeletal   Abdominal   Peds  Hematology negative hematology ROS (+)   Anesthesia Other Findings   Reproductive/Obstetrics negative OB ROS                            Anesthesia Physical Anesthesia Plan  ASA: III  Anesthesia Plan: General   Post-op Pain Management:    Induction: Intravenous  Airway Management Planned: Oral ETT and Video Laryngoscope Planned  Additional Equipment:   Intra-op Plan:   Post-operative Plan: Extubation in OR  Informed Consent: I have reviewed the patients History and Physical, chart, labs and discussed the procedure including the risks, benefits and alternatives for the proposed anesthesia with the patient or authorized representative who has indicated his/her understanding and acceptance.   Dental advisory given  Plan Discussed with: CRNA  Anesthesia Plan Comments:        Anesthesia Quick Evaluation

## 2015-05-12 NOTE — Interval H&P Note (Signed)
History and Physical Interval Note:  05/12/2015 12:52 PM  Bruce Alvarez  has presented today for surgery, with the diagnosis of stones  The various methods of treatment have been discussed with the patient and family. After consideration of risks, benefits and other options for treatment, the patient has consented to  Procedure(s): ENDOSCOPIC RETROGRADE CHOLANGIOPANCREATOGRAPHY (ERCP) (N/A) as a surgical intervention .  The patient's history has been reviewed, patient examined, no change in status, stable for surgery.  I have reviewed the patient's chart and labs.  Questions were answered to the patient's satisfaction.     Vauda Salvucci C

## 2015-05-12 NOTE — H&P (View-Only) (Signed)
Patient ID: Bruce Alvarez, male   DOB: December 25, 1970, 44 y.o.   MRN: [id number]     [address] [place], [place] [address]    Phone: [phone] FAX: [phone]     Subjective: Pain is much better. LFTs increased.  T bili up to 4.8.   Denies cp, sob, doe.   Objective:  Vital signs:  Filed Vitals:   05/11/15 2056 05/11/15 2100 05/11/15 2308 05/12/15 0544  BP: 201/117 201/117 184/84 147/97  Pulse:  107 104 103  Temp:    98.2 F (36.8 C)  TempSrc:    Oral  Resp:    24  Height:      Weight:      SpO2:   94% 97%    Last BM Date: 05/11/15  Intake/Output   Yesterday:  08/03 0701 - 08/04 0700 In: 1200 [I.V.:1200] Out: -  This shift:    I/O last 3 completed shifts: In: 1200 [I.V.:1200] Out: -     Physical Exam: General: Pt awake/alert/oriented x4 in no acute distress Abdomen: Soft.  Nondistended.  Nontender.  No evidence of peritonitis.  No incarcerated hernias.    Problem List:   Principal Problem:   Choledocholithiasis Active Problems:   Hypertension   Low testosterone    Results:   Labs: Results for orders placed or performed during the hospital encounter of 05/11/15 (from the past 48 hour(s))  CBC with Differential     Status: Abnormal   Collection Time: 05/11/15 10:15 AM  Result Value Ref Range   WBC 7.0 4.0 - 10.5 K/uL   RBC 5.06 4.22 - 5.81 MIL/uL   Hemoglobin 17.1 (H) 13.0 - 17.0 g/dL   HCT 50.0 39.0 - 52.0 %   MCV 98.8 78.0 - 100.0 fL   MCH 33.8 26.0 - 34.0 pg   MCHC 34.2 30.0 - 36.0 g/dL   RDW 14.3 11.5 - 15.5 %   Platelets 197 150 - 400 K/uL   Neutrophils Relative % 74 43 - 77 %   Neutro Abs 5.2 1.7 - 7.7 K/uL   Lymphocytes Relative 17 12 - 46 %   Lymphs Abs 1.2 0.7 - 4.0 K/uL   Monocytes Relative 8 3 - 12 %   Monocytes Absolute 0.6 0.1 - 1.0 K/uL   Eosinophils Relative 1 0 - 5 %   Eosinophils Absolute 0.0 0.0 - 0.7 K/uL   Basophils Relative 0 0 - 1 %   Basophils Absolute  0.0 0.0 - 0.1 K/uL  Comprehensive metabolic panel     Status: Abnormal   Collection Time: 05/11/15 10:15 AM  Result Value Ref Range   Sodium 135 135 - 145 mmol/L   Potassium 3.9 3.5 - 5.1 mmol/L   Chloride 96 (L) 101 - 111 mmol/L   CO2 27 22 - 32 mmol/L   Glucose, Bld 203 (H) 65 - 99 mg/dL   BUN 10 6 - 20 mg/dL   Creatinine, Ser 1.22 0.61 - 1.24 mg/dL   Calcium 9.1 8.9 - 10.3 mg/dL   Total Protein 7.5 6.5 - 8.1 g/dL   Albumin 3.9 3.5 - 5.0 g/dL   AST 254 (H) 15 - 41 U/L   ALT 181 (H) 17 - 63 U/L   Alkaline Phosphatase 115 38 - 126 U/L   Total Bilirubin 2.4 (H) 0.3 - 1.2 mg/dL   GFR calc non Af Amer >60 >60 mL/min   GFR calc Af Amer >60 >  60 mL/min    Comment: (NOTE) The eGFR has been calculated using the CKD EPI equation. This calculation has not been validated in all clinical situations. eGFR's persistently <60 mL/min signify possible Chronic Kidney Disease.    Anion gap 12 5 - 15  Lipase, blood     Status: Abnormal   Collection Time: 05/11/15 10:15 AM  Result Value Ref Range   Lipase 172 (H) 22 - 51 U/L  Troponin I     Status: Abnormal   Collection Time: 05/11/15 10:15 AM  Result Value Ref Range   Troponin I 0.04 (H) <0.031 ng/mL    Comment:        PERSISTENTLY INCREASED TROPONIN VALUES IN THE RANGE OF 0.04-0.49 ng/mL CAN BE SEEN IN:       -UNSTABLE ANGINA       -CONGESTIVE HEART FAILURE       -MYOCARDITIS       -CHEST TRAUMA       -ARRYHTHMIAS       -LATE PRESENTING MYOCARDIAL INFARCTION       -COPD   CLINICAL FOLLOW-UP RECOMMENDED.   Urinalysis, Routine w reflex microscopic (not at [facility])     Status: Abnormal   Collection Time: 05/11/15 11:42 AM  Result Value Ref Range   Color, Urine AMBER (A) YELLOW    Comment: BIOCHEMICALS MAY BE AFFECTED BY COLOR   APPearance CLOUDY (A) CLEAR   Specific Gravity, Urine 1.017 1.005 - 1.030   pH 8.5 (H) 5.0 - 8.0   Glucose, UA NEGATIVE NEGATIVE mg/dL   Hgb urine dipstick NEGATIVE NEGATIVE   Bilirubin Urine NEGATIVE NEGATIVE    Ketones, ur NEGATIVE NEGATIVE mg/dL   Protein, ur 30 (A) NEGATIVE mg/dL   Urobilinogen, UA 1.0 0.0 - 1.0 mg/dL   Nitrite NEGATIVE NEGATIVE   Leukocytes, UA NEGATIVE NEGATIVE  Urine microscopic-add on     Status: None   Collection Time: 05/11/15 11:42 AM  Result Value Ref Range   Squamous Epithelial / LPF RARE RARE   WBC, UA 0-2 <3 WBC/hpf   Urine-Other AMORPHOUS URATES/PHOSPHATES   Troponin I     Status: Abnormal   Collection Time: 05/11/15  9:43 PM  Result Value Ref Range   Troponin I 0.06 (H) <0.031 ng/mL    Comment:        PERSISTENTLY INCREASED TROPONIN VALUES IN THE RANGE OF 0.04-0.49 ng/mL CAN BE SEEN IN:       -UNSTABLE ANGINA       -CONGESTIVE HEART FAILURE       -MYOCARDITIS       -CHEST TRAUMA       -ARRYHTHMIAS       -LATE PRESENTING MYOCARDIAL INFARCTION       -COPD   CLINICAL FOLLOW-UP RECOMMENDED.   Comprehensive metabolic panel     Status: Abnormal   Collection Time: 05/12/15  3:51 AM  Result Value Ref Range   Sodium 134 (L) 135 - 145 mmol/L   Potassium 4.9 3.5 - 5.1 mmol/L    Comment: DELTA CHECK NOTED   Chloride 95 (L) 101 - 111 mmol/L   CO2 28 22 - 32 mmol/L   Glucose, Bld 234 (H) 65 - 99 mg/dL   BUN 9 6 - 20 mg/dL   Creatinine, Ser 1.37 (H) 0.61 - 1.24 mg/dL   Calcium 8.6 (L) 8.9 - 10.3 mg/dL   Total Protein 7.4 6.5 - 8.1 g/dL   Albumin 3.8 3.5 - 5.0 g/dL   AST 428 (H) 15 - 41  U/L   ALT 400 (H) 17 - 63 U/L   Alkaline Phosphatase 143 (H) 38 - 126 U/L   Total Bilirubin 4.8 (H) 0.3 - 1.2 mg/dL   GFR calc non Af Amer >60 >60 mL/min   GFR calc Af Amer >60 >60 mL/min    Comment: (NOTE) The eGFR has been calculated using the CKD EPI equation. This calculation has not been validated in all clinical situations. eGFR's persistently <60 mL/min signify possible Chronic Kidney Disease.    Anion gap 11 5 - 15  CBC     Status: Abnormal   Collection Time: 05/12/15  3:51 AM  Result Value Ref Range   WBC 12.0 (H) 4.0 - 10.5 K/uL    Comment: WHITE COUNT  CONFIRMED ON SMEAR   RBC 4.89 4.22 - 5.81 MIL/uL   Hemoglobin 17.0 13.0 - 17.0 g/dL   HCT 50.1 39.0 - 52.0 %   MCV 102.5 (H) 78.0 - 100.0 fL   MCH 34.8 (H) 26.0 - 34.0 pg   MCHC 33.9 30.0 - 36.0 g/dL   RDW 15.0 11.5 - 15.5 %   Platelets 294 150 - 400 K/uL    Comment: PLATELET COUNT CONFIRMED BY SMEAR    Imaging / Studies: US Abdomen Limited  05/11/2015   CLINICAL DATA:  Epigastric pain  EXAM: US ABDOMEN LIMITED - RIGHT UPPER QUADRANT  COMPARISON:  MRCP 04/02/10  FINDINGS: Gallbladder:  Contracted gallbladder with multiple gallstones. There is mild thickening of gallbladder wall up to 3.2 mm. No sonographic Murphy's sign.  Common bile duct:  Diameter: There is distended CBD up to 10.8 mm. Probable stone within CBD measures at least 1.2 cm. Further correlation with ERCP or MRCP could be performed.  Liver:  There is diffuse increased echogenicity of the liver. Mild intrahepatic biliary ductal dilatation. No focal hepatic mass.  IMPRESSION: Contracted gallbladder. Multiple gallstones are noted within gallbladder. There is CBD dilatation up to 10.8 mm. Probable stone within CBD measures at least 1.2 cm. Diffuse increased echogenicity of the liver consistent with fatty infiltration. Mild intrahepatic biliary ductal dilatation.  These results were called by telephone at the time of interpretation on 05/11/2015 at 12:08 pm to Dr. Carmin Muskrat , who verbally acknowledged these results.   Electronically Signed   By: Lahoma Crocker M.D.   On: 05/11/2015 12:08    Medications / Allergies:  Scheduled Meds: . antiseptic oral rinse  7 mL Mouth Rinse q12n4p  . carvedilol  12.5 mg Oral BID WC  . chlorhexidine  15 mL Mouth Rinse BID  . fentaNYL (SUBLIMAZE) injection  50 mcg Intravenous Once  . heparin  5,000 Units Subcutaneous 3 times per day   Continuous Infusions: . sodium chloride 125 mL/hr at 05/12/15 0444   PRN Meds:.hydrALAZINE, HYDROmorphone (DILAUDID) injection, ondansetron **OR** ondansetron (ZOFRAN)  IV  Antibiotics: Anti-infectives    None        Assessment/Plan Cholelithiasis/choledocholithiasis -ERCP, then cholecystectomy, likely tomorrow Indeterminate trops-we need cardiology clearance pre-operatively FEN-NPO after midnight Dispo-ERCP today  Erby Pian, [facility] Surgery Pager [phone](7A-4:30P) For consults and floor pages call [phone](7A-4:30P)  05/12/2015 8:22 AM

## 2015-05-12 NOTE — Anesthesia Postprocedure Evaluation (Signed)
  Anesthesia Post-op Note  Patient: Bruce Alvarez  Procedure(s) Performed: Procedure(s): ENDOSCOPIC RETROGRADE CHOLANGIOPANCREATOGRAPHY (ERCP) (N/A)  Patient Location: PACU  Anesthesia Type:General  Level of Consciousness: awake and alert   Airway and Oxygen Therapy: Patient Spontanous Breathing  Post-op Pain: Controlled  Post-op Assessment: Post-op Vital signs reviewed, Patient's Cardiovascular Status Stable and Respiratory Function Stable  Post-op Vital Signs: Reviewed  Filed Vitals:   05/12/15 1445  BP: 122/70  Pulse: 89  Temp:   Resp: 21    Complications: No apparent anesthesia complications

## 2015-05-12 NOTE — Consult Note (Signed)
[facility] Consultation Note  Referring Provider: Dr. Doneen Poisson (Medical Teaching Service) Primary Care Physician:  Dorothey Baseman, MD  Reason for Consultation:  Abdominal pain, elevated LFTs  HPI: Bruce Alvarez is a 44 y.o. male whom we've been asked to see for above problem.  Patient had some biliary colic symptoms with what sounds like passed bile duct stone in 2011.  Patient declined cholecystectomy at that time.  He was doing ok from GI standpoint since then until couple days ago, when he had acute onset severe epigastric and right upper quadrant abdominal pain.  Went to ED, ultrasound showed dilated bile duct, 1.2cm stone in CBD and liver tests elevated.  Liver tests more elevated and leukocytosis developed today.  He tells me his pain was more severe last night, but improved with narcotics.  He has had some nausea and vomiting with narcotics.  No change in bowel habits, blood in stool, hematemesis, unintentional weight loss.  Denies fevers or chills.   Past Medical History  Diagnosis Date  . Hypertension   . Low testosterone   . Sleep apnea     "not technically dx'd by a physician" (05/11/2015)  . Depression     "took effexor til ~ 02/2015" (05/11/2015)    Past Surgical History  Procedure Laterality Date  . No past surgeries      Prior to Admission medications   Medication Sig Start Date End Date Taking? Authorizing Provider  losartan-hydrochlorothiazide (HYZAAR) 100-25 MG per tablet Take 1 tablet by mouth daily.  03/18/15 03/17/16 Yes Historical Provider, MD  MILK THISTLE PO Take 1 capsule by mouth daily.   Yes Historical Provider, MD  Multiple Vitamin (MULTIVITAMIN WITH MINERALS) TABS tablet Take 1 tablet by mouth daily.   Yes Historical Provider, MD  testosterone cypionate (DEPOTESTOSTERONE CYPIONATE) 200 MG/ML injection Inject 100 mg into the muscle every 14 (fourteen) days.  11/05/14  Yes Historical Provider, MD    Current Facility-Administered Medications   Medication Dose Route Frequency Provider Last Rate Last Dose  . 0.9 %  sodium chloride infusion   Intravenous Continuous Onnie Boer, MD 125 mL/hr at 05/12/15 0444    . antiseptic oral rinse (CPC / CETYLPYRIDINIUM CHLORIDE 0.05%) solution 7 mL  7 mL Mouth Rinse q12n4p Doneen Poisson, MD      . carvedilol (COREG) tablet 12.5 mg  12.5 mg Oral BID WC Ejiroghene Wendall Stade, MD   12.5 mg at 05/12/15 0748  . chlorhexidine (PERIDEX) 0.12 % solution 15 mL  15 mL Mouth Rinse BID Doneen Poisson, MD   15 mL at 05/12/15 0748  . fentaNYL (SUBLIMAZE) injection 50 mcg  50 mcg Intravenous Once Ejiroghene E Mariea Clonts, MD   50 mcg at 05/11/15 1930  . heparin injection 5,000 Units  5,000 Units Subcutaneous 3 times per day Onnie Boer, MD   5,000 Units at 05/11/15 2155  . hydrALAZINE (APRESOLINE) injection 10 mg  10 mg Intravenous Q4H PRN Ejiroghene Wendall Stade, MD   10 mg at 05/11/15 2056  . HYDROmorphone (DILAUDID) injection 1-2 mg  1-2 mg Intravenous Q2H PRN Courtney Paris, MD   1 mg at 05/12/15 0748  . ondansetron (ZOFRAN) tablet 4 mg  4 mg Oral Q6H PRN Ejiroghene Wendall Stade, MD       Or  . ondansetron (ZOFRAN) injection 4 mg  4 mg Intravenous Q6H PRN Ejiroghene Wendall Stade, MD   4 mg at 05/11/15 2055    Allergies as of 05/11/2015 - Review Complete 05/11/2015  Allergen  Reaction Noted  . Benzocaine Swelling 05/11/2015  . Other  05/11/2015    Family History  Problem Relation Age of Onset  . Heart attack Father 50  . Heart attack Paternal Grandfather 52  . Breast cancer Mother     History   Social History  . Marital Status: Married    Spouse Name: N/A  . Number of Children: N/A  . Years of Education: N/A   Occupational History  . Therapist, occupational   Social History Main Topics  . Smoking status: Current Every Day Smoker -- 10 years    Types: Cigars  . Smokeless tobacco: Never Used  . Alcohol Use: 12.6 oz/week    21 Shots of liquor per week     Comment: 05/11/2015  "0-5 shots of boubon/day"  . Drug Use: No  . Sexual Activity: Yes   Other Topics Concern  . Not on file   Social History Narrative    Review of Systems: Positive = bold Gen: Denies any fever, chills, rigors, night sweats, anorexia, fatigue, weakness, malaise, involuntary weight loss, and sleep disorder CV: Denies chest pain, angina, palpitations, syncope, orthopnea, PND, peripheral edema, and claudication. Resp: Denies dyspnea, cough, sputum, wheezing, coughing up blood. GI: Described in detail in HPI.    GU : Denies urinary burning, blood in urine, urinary frequency, urinary hesitancy, nocturnal urination, and urinary incontinence. MS: Denies joint pain or swelling.  Denies muscle weakness, cramps, atrophy.  Derm: Denies rash, itching, oral ulcerations, hives, unhealing ulcers.  Psych: Denies depression, anxiety, memory loss, suicidal ideation, hallucinations,  and confusion. Heme: Denies bruising, bleeding, and enlarged lymph nodes. Neuro:  Denies any headaches, dizziness, paresthesias. Endo:  Denies any problems with DM, thyroid, adrenal function.  Physical Exam: Vital signs in last 24 hours: Temp:  [98.2 F (36.8 C)-98.6 F (37 C)] 98.2 F (36.8 C) (08/04 0544) Pulse Rate:  [81-107] 103 (08/04 0544) Resp:  [10-26] 24 (08/04 0544) BP: (147-219)/(84-138) 147/97 mmHg (08/04 0544) SpO2:  [74 %-97 %] 97 % (08/04 0544) Weight:  [122.471 kg (270 lb)] 122.471 kg (270 lb) (08/03 2036) Last BM Date: 05/11/15 General:   Alert, obese, older-appearing than stated age Head:  Normocephalic and atraumatic. Eyes:  Scleral icterus bilaterally;  Conjunctiva pink. Ears:  Normal auditory acuity. Nose:  No deformity, discharge,  or lesions. Mouth:  No deformity or lesions.  Oropharynx pink & moist. Neck:  Supple; no masses or thyromegaly. Lungs:  Clear throughout to auscultation.   No wheezes, crackles, or rhonchi. No acute distress. Heart:  Regular rate and rhythm; no murmurs, clicks, rubs,   or gallops. Abdomen:  Soft, protuberant, mild epigastric and right upper quadrant abdominal tenderness. No masses, hepatosplenomegaly or hernias noted. Normal bowel sounds, without guarding, and without rebound.     Msk:  Symmetrical without gross deformities. Normal posture. Pulses:  Normal pulses noted. Extremities:  Without clubbing or edema. Neurologic:  Alert and  oriented x4;  Diffusely weak, otherwise grossly normal neurologically. Skin:  Faintly jaundiced; otherwise intact without significant lesions or rashes. Psych:  Alert and cooperative. Normal mood and affect.   Lab Results:  Recent Labs  05/11/15 1015 05/12/15 0351  WBC 7.0 12.0*  HGB 17.1* 17.0  HCT 50.0 50.1  PLT 197 294   BMET  Recent Labs  05/11/15 1015 05/12/15 0351  NA 135 134*  K 3.9 4.9  CL 96* 95*  CO2 27 28  GLUCOSE 203* 234*  BUN 10 9  CREATININE 1.22 1.37*  CALCIUM 9.1 8.6*   LFT  Recent Labs  05/12/15 0351  PROT 7.4  ALBUMIN 3.8  AST 428*  ALT 400*  ALKPHOS 143*  BILITOT 4.8*   PT/INR No results for input(s): LABPROT, INR in the last 72 hours.  Studies/Results: US Abdomen Limited  05/11/2015   CLINICAL DATA:  Epigastric pain  EXAM: US ABDOMEN LIMITED - RIGHT UPPER QUADRANT  COMPARISON:  MRCP 04/02/10  FINDINGS: Gallbladder:  Contracted gallbladder with multiple gallstones. There is mild thickening of gallbladder wall up to 3.2 mm. No sonographic Murphy's sign.  Common bile duct:  Diameter: There is distended CBD up to 10.8 mm. Probable stone within CBD measures at least 1.2 cm. Further correlation with ERCP or MRCP could be performed.  Liver:  There is diffuse increased echogenicity of the liver. Mild intrahepatic biliary ductal dilatation. No focal hepatic mass.  IMPRESSION: Contracted gallbladder. Multiple gallstones are noted within gallbladder. There is CBD dilatation up to 10.8 mm. Probable stone within CBD measures at least 1.2 cm. Diffuse increased echogenicity of the liver  consistent with fatty infiltration. Mild intrahepatic biliary ductal dilatation.  These results were called by telephone at the time of interpretation on 05/11/2015 at 12:08 pm to Dr. Gerhard Munch , who verbally acknowledged these results.   Electronically Signed   By: Natasha Mead M.D.   On: 05/11/2015 12:08    Impression:  1.  Obstructive jaundice.  Suspect bile duct stone. 2.  Elevated LFTs, increasing, suspect bile duct stone. 3.  Dilated bile duct. 4.  Epigastric and right upper quadrant abdominal pain.  Plan:  1.  Recheck lipase stat today; current symptoms however not consistent with evolving pancreatitis. 2.  Keep NPO. 3.  If lipase is not dramatically elevated, patient would best next benefit from ERCP. 4.  We are trying to get ERCP set up for today, tentatively scheduled 1:00 pm this afternoon. 5.  Risks (up to and including bleeding, infection, perforation, pancreatitis that can be complicated by infected necrosis and death), benefits (removal of stones, alleviating blockage, decreasing risk of cholangitis or choledocholithiasis-related pancreatitis), and alternatives (watchful waiting, percutaneous transhepatic cholangiography) of ERCP were explained to patient/family in detail and patient elects to proceed.   LOS: 1 day   Seara Hinesley M  05/12/2015, 8:11 AM  Pager [phone] If no answer or after 5 PM call [phone]

## 2015-05-12 NOTE — Op Note (Signed)
[facility] [address] [place], [address]   ERCP PROCEDURE REPORT        EXAM DATE: 05/21/15  PATIENT NAME:          Bruce Alvarez, Bruce Alvarez          MR #:        [id number]  BIRTHDATE:       1971/10/07     VISIT #:     [phone] ATTENDING:     Dorena Cookey, MD     STATUS:     outpatient ASSISTANT:      Oletha Blend and Dwain Sarna  INDICATIONS:  The patient is a 44 yr old male here for an ERCP due to PROCEDURE PERFORMED:     ERCP with sphincterotomy and stone extraction MEDICATIONS:     general anesthesia  CONSENT: The patient understands the risks and benefits of the procedure and understands that these risks include, but are not limited to: sedation, allergic reaction, infection, perforation and/or bleeding. Alternative means of evaluation and treatment include, among others: physical exam, x-rays, and/or surgical intervention. The patient elects to proceed with this endoscopic procedure.  DESCRIPTION OF PROCEDURE: During intra-op preparation period all mechanical & medical equipment was checked for proper function. Hand hygiene and appropriate measures for infection prevention was taken. After the risks, benefits and alternatives of the procedure were thoroughly explained, Informed was verified, confirmed and timeout was successfully executed by the treatment team. With the patient in left semi-prone position, medications were administered intravenously.The Pentax Ercp Scope [id number] was passed from the mouth into the esophagus and further advanced from the esophagus into the stomach. From stomach scope was directed to the papilla of Vater     .  Major papilla was aligned with the duodenoscope. The scope position was confirmed fluoroscopically. Rest of the findings/therapeutics are given below. The scope was then completely withdrawn from the patient and the procedure completed. The pulse, BP, and O2 saturation were monitored and  documented by the physician and the nursing staff throughout the entire procedure. The patient was cared for as planned according to standard protocol. The patient was then discharged to recovery in stable condition and with appropriate post procedure care. Estimated blood loss is zero unless otherwise noted in this procedure report.  the scope was advanced beyond the stomach into the duodenum to the papilla of Vater which had a very large intra-duodenal segment.. Selective cannulation was achieved with the guidewire followed by the sphincterotome and a cholangiogram obtained. This showed a dilated duct with some vague filling defects. The cystic duct was patent and multiple stones were seen within the gallbladder. Pancreatic duct was not entered. A large sphincterotomy was performed and then a 15-18 mm balloon catheter advanced multiple times and inflated and dragged through the papilla delivering multiple stone fragments from 2-10 mm. We continued to make several balloon sweeps did not deliver any further stones and obtained a occlusion cholangiogram at the end of the procedure which appeared showed no further filling defects. There was good flow of bile at the end of the procedure.    ADVERSE EVENT:     none immediate IMPRESSIONS:     common bile duct stones removed after sphincterotomy  RECOMMENDATIONS:     continue antibody exam watch for complications. Check liver function tests in the morning. Elective cholecystectomy per surgery. REPEAT EXAM:   ___________________________________ Dorena Cookey, MD eSigned:  Dorena Cookey, MD 2015-05-21 2:03 PM   cc:  CPT CODES:  ICD9 CODES:  The ICD and CPT codes recommended by this software are interpretations from the data that the clinical staff has captured with the software.  The verification of the translation of this report to the ICD and CPT codes and modifiers is the sole responsibility of the health care institution and  practicing physician where this report was generated.  [employer] will not be held responsible for the validity of the ICD and CPT codes included on this report.  AMA assumes no liability for data contained or not contained herein. CPT is a Publishing rights manager of the [employer].   PATIENT NAME:  Cauy, Bussell MR#: [id number]

## 2015-05-12 NOTE — Anesthesia Procedure Notes (Signed)
Procedure Name: Intubation Date/Time: 05/12/2015 1:01 PM Performed by: Jerilee Hoh Pre-anesthesia Checklist: Patient identified, Emergency Drugs available, Suction available and Patient being monitored Patient Re-evaluated:Patient Re-evaluated prior to inductionOxygen Delivery Method: Circle system utilized Preoxygenation: Pre-oxygenation with 100% oxygen Intubation Type: IV induction Ventilation: Mask ventilation with difficulty, Oral airway inserted - appropriate to patient size and Two handed mask ventilation required Tube type: Oral Tube size: 7.5 mm Number of attempts: 1 Airway Equipment and Method: Stylet and Video-laryngoscopy Placement Confirmation: ETT inserted through vocal cords under direct vision,  positive ETCO2 and breath sounds checked- equal and bilateral Secured at: 22 cm Tube secured with: Tape Dental Injury: Teeth and Oropharynx as per pre-operative assessment  Difficulty Due To: Difficulty was anticipated, Difficult Airway- due to limited oral opening and Difficult Airway- due to large tongue Comments: Smooth IV induction. 2 provider mask ventilation with oral airway in place. DL x1 with #3 Glidescope. Partial view of cords. Atraumatic oral intubation.

## 2015-05-12 NOTE — Progress Notes (Signed)
Subjective:    Currently, the patient is feeling much more comfortable and notes that his pain is under better control. He notes that his pain is a 5-6 out of 10 this AM. He has not other complaints.  Interval Events: Pain controlled with 1mg  IV Dilaudid q2h HTN controlled with pain management and carvedilol. LFTs increased. TBili now 4.8.   Objective:    Vital Signs:   Temp:  [98.2 F (36.8 C)-98.8 F (37.1 C)] 98.2 F (36.8 C) (08/04 1420) Pulse Rate:  [81-107] 89 (08/04 1445) Resp:  [11-27] 21 (08/04 1445) BP: (113-212)/(47-121) 122/70 mmHg (08/04 1445) SpO2:  [74 %-99 %] 96 % (08/04 1445) Weight:  [122.471 kg (270 lb)] 122.471 kg (270 lb) (08/03 2036) Last BM Date: 05/10/15  Intake/Output:   Intake/Output Summary (Last 24 hours) at 05/12/15 1618 Last data filed at 05/12/15 1354  Gross per 24 hour  Intake   2300 ml  Output      0 ml  Net   2300 ml      Physical Exam: General: Vital signs reviewed and noted. Well-developed, well-nourished, in no acute distress; alert, appropriate and cooperative throughout examination.  Lungs:  Normal respiratory effort. Clear to auscultation BL without crackles or wheezes.  Heart: RRR. S1 and S2 normal without gallop, murmur, or rubs.  Abdomen:  BS normoactive. Obese, tender in the epigastrum, moderately tense with some gaurding.  No masses or organomegaly.  Extremities: No pretibial edema. Venous stasis changes vs hemosiderin deposition in the LE bilaterally     Labs:  Basic Metabolic Panel:  Recent Labs Lab 05/11/15 1015 05/12/15 0351  NA 135 134*  K 3.9 4.9  CL 96* 95*  CO2 27 28  GLUCOSE 203* 234*  BUN 10 9  CREATININE 1.22 1.37*  CALCIUM 9.1 8.6*    Liver Function Tests:  Recent Labs Lab 05/11/15 1015 05/12/15 0351  AST 254* 428*  ALT 181* 400*  ALKPHOS 115 143*  BILITOT 2.4* 4.8*  PROT 7.5 7.4  ALBUMIN 3.9 3.8    Recent Labs Lab 05/11/15 1015 05/12/15 0909  LIPASE 172* 81*   CBC:  Recent  Labs Lab 05/11/15 1015 05/12/15 0351  WBC 7.0 12.0*  NEUTROABS 5.2  --   HGB 17.1* 17.0  HCT 50.0 50.1  MCV 98.8 102.5*  PLT 197 294    Cardiac Enzymes:  Recent Labs Lab 05/11/15 1015 05/11/15 2143  TROPONINI 0.04* 0.06*    Other results: EKG: normal sinus rhythm, ST elevation in lead II, mildly prolonged QT interval.  Imaging: US Abdomen Limited  05/11/2015   CLINICAL DATA:  Epigastric pain  EXAM: US ABDOMEN LIMITED - RIGHT UPPER QUADRANT  COMPARISON:  MRCP 04/02/10  FINDINGS: Gallbladder:  Contracted gallbladder with multiple gallstones. There is mild thickening of gallbladder wall up to 3.2 mm. No sonographic Murphy's sign.  Common bile duct:  Diameter: There is distended CBD up to 10.8 mm. Probable stone within CBD measures at least 1.2 cm. Further correlation with ERCP or MRCP could be performed.  Liver:  There is diffuse increased echogenicity of the liver. Mild intrahepatic biliary ductal dilatation. No focal hepatic mass.  IMPRESSION: Contracted gallbladder. Multiple gallstones are noted within gallbladder. There is CBD dilatation up to 10.8 mm. Probable stone within CBD measures at least 1.2 cm. Diffuse increased echogenicity of the liver consistent with fatty infiltration. Mild intrahepatic biliary ductal dilatation.  These results were called by telephone at the time of interpretation on 05/11/2015 at 12:08 pm to Dr.  Gerhard Munch , who verbally acknowledged these results.   Electronically Signed   By: Natasha Mead M.D.   On: 05/11/2015 12:08   Dg Ercp With Sphincterotomy  05/12/2015   CLINICAL DATA:  44 year old Alvarez with choledocholithiasis  EXAM: ERCP  TECHNIQUE: Multiple spot images obtained with the fluoroscopic device and submitted for interpretation post-procedure.  FLUOROSCOPY TIME:  7 minutes 17 seconds reported. Please see GI operative note for further detail.  COMPARISON:  Abdominal ultrasound 05/11/2015  FINDINGS: Two intraoperative spot images demonstrate a flexible  endoscope in the descending duodenum and cannulation of the common bile duct. There is mild biliary ductal dilatation. The cystic duct is patent. Multiple filling defects are present within the gallbladder consistent with cholelithiasis. The second image demonstrates a sphincterotomy device in place.  IMPRESSION: ERCP with sphincterotomy as above.  Cholelithiasis with patent cystic duct.  These images were submitted for radiologic interpretation only. Please see the procedural report for the amount of contrast and the fluoroscopy time utilized.   Electronically Signed   By: Malachy Moan M.D.   On: 05/12/2015 15:50     Medications:    Infusions: . sodium chloride 125 mL/hr at 05/12/15 0444    Scheduled Medications: . antiseptic oral rinse  7 mL Mouth Rinse q12n4p  . carvedilol  12.5 mg Oral BID WC  . chlorhexidine  15 mL Mouth Rinse BID  . fentaNYL (SUBLIMAZE) injection  50 mcg Intravenous Once  . heparin  5,000 Units Subcutaneous 3 times per day    PRN Medications: hydrALAZINE, HYDROmorphone (DILAUDID) injection, ondansetron **OR** ondansetron (ZOFRAN) IV   Assessment/ Plan:    Pt is a 44 y.o. yo Alvarez with a PMHx of HTN and Low T who was admitted on 05/11/2015 with symptoms of acute epigastric pain, which was determined to be secondary to choledocolithiasis. Interventions at this time will be focused on pain management, removal of gall stones, and cholecystectomy.   1) Choledocolithiasis: Pain better. LTFs elevated from previous, TBili 2.4 --> 4.8. Non-jaundiced, non-icteric. Afeb w/ development of mild leukocytosis (WBC 12). H/o presumed choledocolithiasis in 2011 w/o observed stone and no surgical management. [place] yesterday demonstrated cholelithiasis and a 1.2cm CBD stone likely causing obstruction. ERCP today removed several stones from the CBD 2-10 mm after sphincterotomy with good bile flow noted after the procedure. -- GI consulted, s/p ERCP, appreciate recs:  -- CMet AM to follow  LFTs  -- Monitor for post-ERCP pancratitis -- Surg consulted, plan cholecystectomy tomorrow, awaiting recs -- Cards consulted for surgical clearance in the setting of mild trop leak, appreciate recs:  -- Clear for surgery -- Pain managed with IV Dilaudid (  q2h PRN pain) -- IV Zofran (  q6h PRN nausea) -- Clear liquid diet following ERCP -- NPO at MN for cholecystectomy tomorrow  2) Hypertension: Resolved. -- Hold home losartan/HCTZ in the setting of AKI and rising SCr -- Carvedilol 12.5 PO BID -- Aggressive pain management as above -- Hydralizine  IV PRN systolics >22mmHg / diastolic >117mmHg  3) AKI: Acute rise in SCr to 1.22 --> 1.37, likely 2/2 to dehydration. Baseline renal function Cr. 0.80. -- IVF NS /hr -- Hold losartan/HCTZ, consider restarting if renal fxn normalizes  4) Elevated Hgb: Hgb 17, last recorded at 16.8 but 15 at the beginning of the year. Likely due to OSA which can worsen in the setting of exogenous testosterone. -- Follow CBC -- Outpt sleep study for OSA and CPAP trial  5) Hyperglycemia: CBG mildly elevated per PCP records at  175 on 04/01/2015, now 200s today. In the setting illness, will follow for now. Consider A1C to assess from DM.  6) Low T: Chronic for several years, on testosterone replacement injections q 2 wks with poor compliance. Recently, in the past month, began taking regularly as prescribed.  7) Anxiety: Recently d/c'd SSRI earlier this year. Currently asymptomatic.  FEN/GI: -- Clear liquid diet following ERCP -- NPO at MN for surgery tomorrow -- ADAT following surgery   DVT PPX - heparin  CODE STATUS - Full  CONSULTS PLACED - GI, Surgery, Cariology  DISPO - Disposition is deferred at this time, will reassess following surgery tomorrow.   Anticipated discharge in approximately 2 day(s).   The patient does have a current PCP (BRONSTEIN, DAVID, MD) and does need an [facility] hospital follow-up appointment after discharge.     Is the [facility] hospital follow-up appointment a one-time only appointment? no.  Does the patient have transportation limitations that hinder transportation to clinic appointments? yes   SERVICE NEEDED AT DISCHARGE - TO BE DETERMINED DURING HOSPITAL COURSE         Y = Yes, Blank = No PT:   OT:   RN:   Equipment:   Other:    Length of Stay: 1 day(s)  This is a Psychologist, occupational Note.  The care of the patient was discussed with Dr. Andrey Campanile and the assessment and plan formulated with their assistance.  Please see their attached note for official documentation of the daily encounter.  Burna Cash, MS4 Pager: [phone] (7AM-5PM) 05/12/2015, 4:18 PM

## 2015-05-12 NOTE — Progress Notes (Signed)
Subjective: [name] was seen and examined this AM.  His abd pain is well controlled today.  He is agreeable to ERCP today followed by chole while inpatient.  Objective: Vital signs in last 24 hours: Filed Vitals:   05/11/15 2100 05/11/15 2308 05/12/15 0544 05/12/15 1151  BP: 201/117 184/84 147/97 138/88  Pulse: 107 104 103   Temp:   98.2 F (36.8 C) 98.8 F (37.1 C)  TempSrc:   Oral   Resp:   24 27  Height:      Weight:      SpO2:  94% 97% 97%   Weight change:   Intake/Output Summary (Last 24 hours) at 05/12/15 1209 Last data filed at 05/12/15 0911  Gross per 24 hour  Intake   1200 ml  Output      0 ml  Net   1200 ml   General: resting in bed in NAD HEENT:  [place]/AT Cardiac: RRR, no rubs, murmurs or gallops Pulm: clear to auscultation bilaterally, moving normal volumes of air Abd: soft, nontender, nondistended, BS present Ext: warm and well perfused, no pedal edema Neuro: alert and oriented X3, responding appropriately, MAE spontaneously  Lab Results: Basic Metabolic Panel:  Recent Labs Lab 05/11/15 1015 05/12/15 0351  NA 135 134*  K 3.9 4.9  CL 96* 95*  CO2 27 28  GLUCOSE 203* 234*  BUN 10 9  CREATININE 1.22 1.37*  CALCIUM 9.1 8.6*   Liver Function Tests:  Recent Labs Lab 05/11/15 1015 05/12/15 0351  AST 254* 428*  ALT 181* 400*  ALKPHOS 115 143*  BILITOT 2.4* 4.8*  PROT 7.5 7.4  ALBUMIN 3.9 3.8    Recent Labs Lab 05/11/15 1015 05/12/15 0909  LIPASE 172* 81*   CBC:  Recent Labs Lab 05/11/15 1015 05/12/15 0351  WBC 7.0 12.0*  NEUTROABS 5.2  --   HGB 17.1* 17.0  HCT 50.0 50.1  MCV 98.8 102.5*  PLT 197 294   Cardiac Enzymes:  Recent Labs Lab 05/11/15 1015 05/11/15 2143  TROPONINI 0.04* 0.06*   Medications: I have reviewed the patient's current medications. Scheduled Meds: . [facility] Hold] antiseptic oral rinse  7 mL Mouth Rinse q12n4p  . [MAR Hold] carvedilol  12.5 mg Oral BID WC  . [MAR Hold] chlorhexidine  15 mL Mouth Rinse  BID  . ciprofloxacin  400 mg Intravenous Once  . fentaNYL (SUBLIMAZE) injection  50 mcg Intravenous Once  . [MAR Hold] heparin  5,000 Units Subcutaneous 3 times per day  . lactated ringers   Intravenous Once   Continuous Infusions: . sodium chloride 125 mL/hr at 05/12/15 0444  . sodium chloride     PRN Meds:.[MAR Hold] hydrALAZINE, HYDROmorphone (DILAUDID) injection, [MAR Hold]  HYDROmorphone (DILAUDID) injection, [MAR Hold] ondansetron **OR** [MAR Hold] ondansetron (ZOFRAN) IV   Assessment/Plan: 44 year old male with PMH of HTN, low testosterone presents with abdominal pain and found to have CBD stone.  Choledocholithiasis:  He presented with abdominal pain and [place] evidence of CBD stone.  Elevated LFTs, ALP 115 --> 143, tbili 2.4 --> 4.8.  Appreciated GI and surgical interventions and recommendations. - NPO for ERCP today - NPO past MN tonight for lap chole tomorrow - trend CMP - continue pain control with IV dilaudid  Elevated troponin:  Slight trop elevation 0.04 --> 0.04 --> 0.06.  No CP but initially presented with epigastric pain.  EKG with ST depression in one lead (lead II). - re-cycle trops, repeat EKG - consult to cardiology (patient  has hx of early CAD in male relatives) and he will need to be risk stratified for before OR.  AKI:  Patient reports decrease po in recent days so this is likely pre-renal. - NS  - hold losartan-HCTZ - monitor renal function  Hypertension:  Initially elevated in the setting of uncontrolled pain.  Now stable.  Holding home losartan-HCTZ in the setting of AKI. - Coreg 12.5mg  BID started this admission for BP control - prn hydral for SBP > 190/110 - pain control as above  Likely OSA:  Patient's hgb 17.1 at admission.  He has a large neck circumference and wife says he snores and often has apneic periods while sleeping but has not had formal OSA dx nor does he have a CPAP. - advise sleep study at discharge  Hyperglycemia:  No DM dx but patient  says recent A1c elevated.  Glucose in the 200s this admission but this is in the setting of acute illness.   - A1c to assess control in past 3 months  Dispo: Disposition is deferred at this time, awaiting improvement of current medical problems.  Anticipated discharge in approximately 2-3 day(s).   The patient does have a current PCP Dorothey Baseman, MD) and does not need an [facility] hospital follow-up appointment after discharge.  The patient does not know have transportation limitations that hinder transportation to clinic appointments.  .Services Needed at time of discharge: Y = Yes, Blank = No PT:   OT:   RN:   Equipment:   Other:     LOS: 1 day   Yolanda Manges, DO 05/12/2015, 12:09 PM

## 2015-05-12 NOTE — Transfer of Care (Signed)
Immediate Anesthesia Transfer of Care Note  Patient: Bruce Alvarez  Procedure(s) Performed: Procedure(s): ENDOSCOPIC RETROGRADE CHOLANGIOPANCREATOGRAPHY (ERCP) (N/A)  Patient Location: Endoscopy Unit  Anesthesia Type:General  Level of Consciousness: sedated and responds to stimulation  Airway & Oxygen Therapy: Patient Spontanous Breathing and Patient connected to face mask oxygen  Post-op Assessment: Report given to RN, Post -op Vital signs reviewed and stable and Patient moving all extremities  Post vital signs: Reviewed and stable  Last Vitals:  Filed Vitals:   05/12/15 1151  BP: 138/88  Pulse:   Temp: 37.1 C  Resp: 27    Complications: No apparent anesthesia complications

## 2015-05-13 ENCOUNTER — Encounter (HOSPITAL_COMMUNITY): Payer: Self-pay | Admitting: Gastroenterology

## 2015-05-13 ENCOUNTER — Encounter (HOSPITAL_COMMUNITY)
Admission: EM | Disposition: A | Discharge status: Home / Self Care | Payer: Self-pay | Source: Home / Self Care | Attending: Emergency Medicine

## 2015-05-13 DIAGNOSIS — R7989 Other specified abnormal findings of blood chemistry: Secondary | ICD-10-CM

## 2015-05-13 LAB — COMPREHENSIVE METABOLIC PANEL
ALT: 360 U/L — AB (ref 17–63)
AST: 289 U/L — ABNORMAL HIGH (ref 15–41)
Albumin: 3.4 g/dL — ABNORMAL LOW (ref 3.5–5.0)
Alkaline Phosphatase: 146 U/L — ABNORMAL HIGH (ref 38–126)
Anion gap: 10 (ref 5–15)
BUN: 10 mg/dL (ref 6–20)
CALCIUM: 8 mg/dL — AB (ref 8.9–10.3)
CO2: 28 mmol/L (ref 22–32)
Chloride: 95 mmol/L — ABNORMAL LOW (ref 101–111)
Creatinine, Ser: 1.39 mg/dL — ABNORMAL HIGH (ref 0.61–1.24)
GFR calc Af Amer: >60 mL/min (ref >60)
GFR calc non Af Amer: 60 mL/min — ABNORMAL LOW (ref >60)
GLUCOSE: 171 mg/dL — AB (ref 65–99)
Potassium: 4.1 mmol/L (ref 3.5–5.1)
SODIUM: 133 mmol/L — AB (ref 135–145)
Total Bilirubin: 6.4 mg/dL — ABNORMAL HIGH (ref 0.3–1.2)
Total Protein: 6.6 g/dL (ref 6.5–8.1)

## 2015-05-13 LAB — CBC WITH DIFFERENTIAL/PLATELET
Basophils Absolute: 0 10*3/uL (ref 0.0–0.1)
Basophils Relative: 0 % (ref 0–1)
Eosinophils Absolute: 0 10*3/uL (ref 0.0–0.7)
Eosinophils Relative: 1 % (ref 0–5)
HEMATOCRIT: 44.3 % (ref 39.0–52.0)
HEMOGLOBIN: 14.7 g/dL (ref 13.0–17.0)
Lymphocytes Relative: 15 % (ref 12–46)
Lymphs Abs: 1.1 10*3/uL (ref 0.7–4.0)
MCH: 34.1 pg — AB (ref 26.0–34.0)
MCHC: 33.2 g/dL (ref 30.0–36.0)
MCV: 102.8 fL — ABNORMAL HIGH (ref 78.0–100.0)
Monocytes Absolute: 0.6 10*3/uL (ref 0.1–1.0)
Monocytes Relative: 8 % (ref 3–12)
NEUTROS PCT: 76 % (ref 43–77)
Neutro Abs: 5.5 10*3/uL (ref 1.7–7.7)
PLATELETS: 175 10*3/uL (ref 150–400)
RBC: 4.31 MIL/uL (ref 4.22–5.81)
RDW: 14.9 % (ref 11.5–15.5)
WBC: 7.1 10*3/uL (ref 4.0–10.5)

## 2015-05-13 LAB — LIPID PANEL
CHOL/HDL RATIO: 3.9 ratio
Cholesterol: 195 mg/dL (ref 0–200)
HDL: 50 mg/dL (ref >40)
LDL Cholesterol: 120 mg/dL — ABNORMAL HIGH (ref 0–99)
TRIGLYCERIDES: 124 mg/dL (ref <150)
VLDL: 25 mg/dL (ref 0–40)

## 2015-05-13 LAB — LIPASE, BLOOD: Lipase: 19 U/L — ABNORMAL LOW (ref 22–51)

## 2015-05-13 LAB — TROPONIN I: TROPONIN I: 0.14 ng/mL — AB (ref <0.031)

## 2015-05-13 SURGERY — ERCP, WITH INTERVENTION IF INDICATED
Anesthesia: General

## 2015-05-13 MED ORDER — SODIUM CHLORIDE 0.9 % IV SOLN
INTRAVENOUS | Status: AC
  Administered 2015-05-13 – 2015-05-14 (×4): via INTRAVENOUS

## 2015-05-13 MED ORDER — ZOLPIDEM TARTRATE 5 MG PO TABS
10.0000 mg | ORAL_TABLET | Freq: Every evening | ORAL | Status: DC | PRN
  Administered 2015-05-13: 10 mg via ORAL
  Filled 2015-05-13: qty 2

## 2015-05-13 MED ORDER — ACETAMINOPHEN 500 MG PO TABS
1000.0000 mg | ORAL_TABLET | Freq: Four times a day (QID) | ORAL | Status: DC | PRN
  Administered 2015-05-13: 1000 mg via ORAL
  Filled 2015-05-13: qty 2

## 2015-05-13 NOTE — Progress Notes (Signed)
Subjective: [name] was seen and examined this AM.  He is pain-free, would like to advance diet.  Objective: Vital signs in last 24 hours: Filed Vitals:   05/12/15 1600 05/12/15 2137 05/13/15 0133 05/13/15 0629  BP: 112/61 129/84 129/81 156/100  Pulse: 96  98 95  Temp: 98.9 F (37.2 C) 99 F (37.2 C) 99.6 F (37.6 C) 99 F (37.2 C)  TempSrc: Oral Oral Oral Oral  Resp: 20 18 17    Height:      Weight:      SpO2: 92% 94% 91% 98%   Weight change:   Intake/Output Summary (Last 24 hours) at 05/13/15 0713 Last data filed at 05/12/15 2139  Gross per 24 hour  Intake   1320 ml  Output      0 ml  Net   1320 ml   General: resting in bed in NAD HEENT:  [place]/AT Cardiac: RRR, no rubs, murmurs or gallops Pulm: clear to auscultation bilaterally, moving normal volumes of air Abd: soft, nontender, nondistended, BS present Ext: warm and well perfused, no pedal edema Neuro: alert and oriented X3, responding appropriately, MAE spontaneously  Lab Results: Basic Metabolic Panel:  Recent Labs Lab 05/12/15 0351 05/13/15 0035  NA 134* 133*  K 4.9 4.1  CL 95* 95*  CO2 28 28  GLUCOSE 234* 171*  BUN 9 10  CREATININE 1.37* 1.39*  CALCIUM 8.6* 8.0*   Liver Function Tests:  Recent Labs Lab 05/12/15 0351 05/13/15 0035  AST 428* 289*  ALT 400* 360*  ALKPHOS 143* 146*  BILITOT 4.8* 6.4*  PROT 7.4 6.6  ALBUMIN 3.8 3.4*    Recent Labs Lab 05/12/15 0909 05/13/15 0035  LIPASE 81* 19*   CBC:  Recent Labs Lab 05/11/15 1015 05/12/15 0351 05/13/15 0035  WBC 7.0 12.0* 7.1  NEUTROABS 5.2  --  5.5  HGB 17.1* 17.0 14.7  HCT 50.0 50.1 44.3  MCV 98.8 102.5* 102.8*  PLT 197 294 175   Cardiac Enzymes:  Recent Labs Lab 05/12/15 1630 05/12/15 2105 05/13/15 0035  TROPONINI 0.19* 0.18* 0.14*   Medications: I have reviewed the patient's current medications. Scheduled Meds: . antiseptic oral rinse  7 mL Mouth Rinse q12n4p  . carvedilol  12.5 mg Oral BID WC  .  chlorhexidine  15 mL Mouth Rinse BID  . fentaNYL (SUBLIMAZE) injection  50 mcg Intravenous Once  . heparin  5,000 Units Subcutaneous 3 times per day   Continuous Infusions: none   PRN Meds:.hydrALAZINE, HYDROmorphone (DILAUDID) injection, ondansetron **OR** ondansetron (ZOFRAN) IV   Assessment/Plan: 44 year old male with PMH of HTN, low testosterone presents with abdominal pain and found to have CBD stone.  Choledocholithiasis:  He presented with abdominal pain and [place] evidence of CBD stone.  Elevated LFTs, ALP 115 --> 143, tbili 2.4 --> 6.4, s/p ERCP .  Appreciated GI and surgical interventions and recommendations. - NPO past MN tonight for possible lap chole tomorrow - trend CMP - continue pain control with IV dilaudid  Elevated troponin:  Slight trop elevation 0.04 --> 0.04 --> 0.06 thought to be due to acute illness, HTN.  Repeat trops peaked at 0.19 and down to 0.14.  Cards has seen him and do not think we are dealing with ACS.   - appreciate cards recommendations - 2D ECHO tomorrow to exclude WMA  AKI:  Patient reports decrease po in recent days so this is likely pre-renal. - continue NS  - hold losartan-HCTZ - monitor renal function  Hypertension:  Stable. Holding home losartan-HCTZ in the setting of AKI. - Coreg 12.5mg  BID started this admission for BP control - prn hydral for SBP > 190/110 - pain control as above  Likely OSA:  Patient's hgb 17.1 at admission.  He has a large neck circumference and wife says he snores and often has apneic periods while sleeping but has not had formal OSA dx nor does he have a CPAP. - advise sleep study at discharge  Hyperglycemia:  No DM dx but patient says recent A1c elevated.  Glucose in the 200s this admission but this is in the setting of acute illness.   - A1c to assess control in past 3 months  Dispo: Disposition is deferred at this time, awaiting improvement of current medical problems.  Anticipated discharge in approximately 2-3  day(s).   The patient does have a current PCP Bruce Baseman, MD) and does not need an [facility] hospital follow-up appointment after discharge.  The patient does not know have transportation limitations that hinder transportation to clinic appointments.  .Services Needed at time of discharge: Y = Yes, Blank = No PT:   OT:   RN:   Equipment:   Other:     LOS: 2 days   Yolanda Manges, DO 05/13/2015, 7:13 AM

## 2015-05-13 NOTE — Progress Notes (Signed)
Subjective:    Patient reports that he did not sleep well secondary to throat pain from his procedure and some minor anxiety from being in the hospital for several days. He endorse that he is feeling well in terms of his pain and has recovered well from his ERCP. No indications of post-ERCP pacreatitis. He has not had a significant appetite after his procedure yesterday, but has had some broth and other clear liquids which he tolerated well. He is ready for his cholecystectomy.  Interval Events: Cleared for surgery by cardiology yesterday, surgery requests cards to revist in the setting of trop leak. Transaminases have begun to trend down. TBili now elevated to 6.4 (4.8 yesterday). Lipase continues to trend down to 19. WBC normalized. SCr remains elevated at 1.39 today. Repeat trop cycle peaked at 0.19, trending down. Repeat ECG shows resolution of isolated ST depression in lead II.   Objective:    Vital Signs:   Temp:  [98.2 F (36.8 C)-99.6 F (37.6 C)] 99 F (37.2 C) (08/05 0629) Pulse Rate:  [89-98] 95 (08/05 0629) Resp:  [12-27] 17 (08/05 0133) BP: (112-156)/(47-100) 156/100 mmHg (08/05 0629) SpO2:  [91 %-99 %] 98 % (08/05 0629) Last BM Date: 05/10/15  Intake/Output:   Intake/Output Summary (Last 24 hours) at 05/13/15 0825 Last data filed at 05/12/15 2139  Gross per 24 hour  Intake   1320 ml  Output      0 ml  Net   1320 ml      Physical Exam: General: Vital signs reviewed and noted. Well-developed, well-nourished, mildly anxious appearing; alert, appropriate and cooperative throughout examination.  Lungs:  Normal respiratory effort. Clear to auscultation BL without crackles or wheezes.  Heart: RRR. S1 and S2 normal without gallop, murmur, or rubs.  Abdomen:  BS normoactive. Obese, mildly tender in the epigastrum.  No masses or organomegaly.  Extremities: No pretibial edema. Venous stasis changes vs hemosiderin deposition in the LE bilaterally     Labs:  Basic  Metabolic Panel:  Recent Labs Lab 05/11/15 1015 05/12/15 0351 05/13/15 0035  NA 135 134* 133*  K 3.9 4.9 4.1  CL 96* 95* 95*  CO2 GLUCOSE 203* 234* 171*  BUN CREATININE 1.22 1.37* 1.39*  CALCIUM 9.1 8.6* 8.0*    Liver Function Tests:  Recent Labs Lab 05/11/15 1015 05/12/15 0351 05/13/15 0035  AST 254* 428* 289*  ALT 181* 400* 360*  ALKPHOS 115 143* 146*  BILITOT 2.4* 4.8* 6.4*  PROT 7.5 7.4 6.6  ALBUMIN 3.9 3.8 3.4*    Recent Labs Lab 05/11/15 1015 05/12/15 0909 05/13/15 0035  LIPASE 172* 81* 19*   CBC:  Recent Labs Lab 05/11/15 1015 05/12/15 0351 05/13/15 0035  WBC 7.0 12.0* 7.1  NEUTROABS 5.2  --  5.5  HGB 17.1* 17.0 14.7  HCT 50.0 50.1 44.3  MCV 98.8 102.5* 102.8*  PLT 197 294 175    Cardiac Enzymes:  Recent Labs Lab 05/11/15 1015 05/11/15 2143 05/12/15 1630 05/12/15 2105 05/13/15 0035  TROPONINI 0.04* 0.06* 0.19* 0.18* 0.14*    Other results: EKG: normal sinus rhythm, mildly prolonged QT interval.  Imaging: US Abdomen Limited  05/11/2015   CLINICAL DATA:  Epigastric pain  EXAM: US ABDOMEN LIMITED - RIGHT UPPER QUADRANT  COMPARISON:  MRCP 04/02/10  FINDINGS: Gallbladder:  Contracted gallbladder with multiple gallstones. There is mild thickening of gallbladder wall up to 3.2 mm. No sonographic Murphy's sign.  Common bile duct:  Diameter:  There is distended CBD up to 10.8 mm. Probable stone within CBD measures at least 1.2 cm. Further correlation with ERCP or MRCP could be performed.  Liver:  There is diffuse increased echogenicity of the liver. Mild intrahepatic biliary ductal dilatation. No focal hepatic mass.  IMPRESSION: Contracted gallbladder. Multiple gallstones are noted within gallbladder. There is CBD dilatation up to 10.8 mm. Probable stone within CBD measures at least 1.2 cm. Diffuse increased echogenicity of the liver consistent with fatty infiltration. Mild intrahepatic biliary ductal dilatation.  These results were  called by telephone at the time of interpretation on 05/11/2015 at 12:08 pm to Dr. Gerhard Munch , who verbally acknowledged these results.   Electronically Signed   By: Natasha Mead M.D.   On: 05/11/2015 12:08   Dg Ercp With Sphincterotomy  05/12/2015   CLINICAL DATA:  44 year old male with choledocholithiasis  EXAM: ERCP  TECHNIQUE: Multiple spot images obtained with the fluoroscopic device and submitted for interpretation post-procedure.  FLUOROSCOPY TIME:  7 minutes 17 seconds reported. Please see GI operative note for further detail.  COMPARISON:  Abdominal ultrasound 05/11/2015  FINDINGS: Two intraoperative spot images demonstrate a flexible endoscope in the descending duodenum and cannulation of the common bile duct. There is mild biliary ductal dilatation. The cystic duct is patent. Multiple filling defects are present within the gallbladder consistent with cholelithiasis. The second image demonstrates a sphincterotomy device in place.  IMPRESSION: ERCP with sphincterotomy as above.  Cholelithiasis with patent cystic duct.  These images were submitted for radiologic interpretation only. Please see the procedural report for the amount of contrast and the fluoroscopy time utilized.   Electronically Signed   By: Malachy Moan M.D.   On: 05/12/2015 15:50     Medications:    Infusions:    Scheduled Medications: . antiseptic oral rinse  7 mL Mouth Rinse q12n4p  . carvedilol  12.5 mg Oral BID WC  . chlorhexidine  15 mL Mouth Rinse BID  . fentaNYL (SUBLIMAZE) injection  50 mcg Intravenous Once  . heparin  5,000 Units Subcutaneous 3 times per day    PRN Medications: hydrALAZINE, HYDROmorphone (DILAUDID) injection, ondansetron **OR** ondansetron (ZOFRAN) IV   Assessment/ Plan:    Pt is a 44 y.o. yo male with a PMHx of HTN and Low T who was admitted on 05/11/2015 with symptoms of acute epigastric pain, which was determined to be secondary to choledocolithiasis. Interventions at this time will be  focused on pain management, removal of gall stones, and cholecystectomy.   1) Choledocolithiasis: Pain controlled. Transaminases downtrending, TBili 2.4 --> 4.8 --> 6.4. Non-jaundiced, non-icteric. Afeb w/ resolution of leukocytosis (WBC 1 --> 7.1). H/o presumed choledocolithiasis in 2011 w/o observed stone and no surgical management. [place] yesterday demonstrated cholelithiasis and a 1.2cm CBD stone likely causing obstruction. ERCP yesterday removed several stones from the CBD 2-10 mm after sphincterotomy with good bile flow noted after the procedure. -- GI consulted, s/p ERCP, appreciate recs:  -- request Cards to see again following post-procedure trop leak  -- Monitor for post-ERCP pancratitis, TBili elevation likely due to ampullary edema  -- CMet and Lipase tomorrow -- Surg consulted, plan cholecystectomy tomorrow, awaiting recs -- Cards consulted for surgical clearance in the setting of mild trop leak, appreciate recs:  -- Cleared for surgery yesterday, awaiting recs today following new trop leak -- Pain managed with IV Dilaudid (1mg  q2h PRN pain) -- IV Zofran (4mg  q6h PRN nausea) -- NPO for cholecystectomy tomorrow  2) Hypertension: Resolved. -- Hold  home losartan/HCTZ in the setting of AKI and rising SCr -- Carvedilol 12.5 PO BID -- Aggressive pain management as above -- Hydralizine 10mg  IV PRN systolics >28mmHg / diastolic >159mmHg  3) Alcohol abuse: Reports some anxiety and restlessness, no tremor, sweats, or VS instability. Drinks 14 shots of liquor weekly. -- CIWA protocol  4) AKI: Acute rise in SCr to 1.22 --> 1.37 -->1.39, likely 2/2 to dehydration. Baseline renal function Cr. 0.80. -- IVF NS @125mL /hr -- Hold losartan/HCTZ, consider restarting if renal fxn normalizes  5) Elevated Hgb: Hgb down-trended to 14.7 from 17. Elevation likely due to OSA which can worsen in the setting of exogenous testosterone. -- Follow CBC -- Outpt sleep study for OSA and CPAP trial  6)  Hyperglycemia: CBG mildly elevated per PCP records at 175 on 04/01/2015, now 200s today. In the setting illness, will follow for now. Consider A1C to assess from DM.  7) Low T: Chronic for several years, on testosterone replacement injections q 2 wks with poor compliance. Recently, in the past month, began taking regularly as prescribed.  7) Anxiety: Recently d/c'd SSRI earlier this year. Currently asymptomatic.  FEN/GI: -- IVF -- Clear liquid diet today -- NPO @ MN for surgery tomorrow -- ADAT following surgery tomorrow   DVT PPX - heparin  CODE STATUS - Full  CONSULTS PLACED - GI, Surgery, Cariology  DISPO - Disposition is deferred at this time, will reassess following surgery tomorrow.   Anticipated discharge in approximately 2 day(s).   The patient does have a current PCP (BRONSTEIN, DAVID, MD) and does need an [facility] hospital follow-up appointment after discharge.    Is the [facility] hospital follow-up appointment a one-time only appointment? no.  Does the patient have transportation limitations that hinder transportation to clinic appointments? yes   SERVICE NEEDED AT DISCHARGE - TO BE DETERMINED DURING HOSPITAL COURSE         Y = Yes, Blank = No PT:   OT:   RN:   Equipment:   Other:    Length of Stay: 2 day(s)  This is a Psychologist, occupational Note.  The care of the patient was discussed with Dr. Andrey Campanile and the assessment and plan formulated with their assistance.  Please see their attached note for official documentation of the daily encounter.  Burna Cash, MS4 Pager: [phone] (7AM-5PM) 05/13/2015, 8:25 AM

## 2015-05-13 NOTE — Progress Notes (Signed)
Subjective: Abdominal pain improved.  Objective: Vital signs in last 24 hours: Temp:  [98.2 F (36.8 C)-100.2 F (37.9 C)] 100.2 F (37.9 C) (08/05 0949) Pulse Rate:  [89-98] 95 (08/05 0949) Resp:  [12-26] 19 (08/05 0949) BP: (112-156)/(47-100) 146/93 mmHg (08/05 0949) SpO2:  [91 %-99 %] 93 % (08/05 0949) Weight change:  Last BM Date: 05/10/15  PE: GEN:  NAD, obese ABD:  Protuberant, soft, minimally tender  Lab Results: CBC    Component Value Date/Time   WBC 7.1 05/13/2015 0035   RBC 4.31 05/13/2015 0035   HGB 14.7 05/13/2015 0035   HCT 44.3 05/13/2015 0035   PLT 175 05/13/2015 0035   MCV 102.8* 05/13/2015 0035   MCH 34.1* 05/13/2015 0035   MCHC 33.2 05/13/2015 0035   RDW 14.9 05/13/2015 0035   LYMPHSABS 1.1 05/13/2015 0035   MONOABS 0.6 05/13/2015 0035   EOSABS 0.0 05/13/2015 0035   BASOSABS 0.0 05/13/2015 0035   CMP     Component Value Date/Time   NA 133* 05/13/2015 0035   K 4.1 05/13/2015 0035   CL 95* 05/13/2015 0035   CO2 28 05/13/2015 0035   GLUCOSE 171* 05/13/2015 0035   BUN 10 05/13/2015 0035   CREATININE 1.39* 05/13/2015 0035   CALCIUM 8.0* 05/13/2015 0035   PROT 6.6 05/13/2015 0035   ALBUMIN 3.4* 05/13/2015 0035   AST 289* 05/13/2015 0035   ALT 360* 05/13/2015 0035   ALKPHOS 146* 05/13/2015 0035   BILITOT 6.4* 05/13/2015 0035   GFRNONAA 60* 05/13/2015 0035   GFRAA >60 05/13/2015 0035   Assessment:  1.  Choledocholithiasis, s/p ERCP with biliary sphincterotomy and stone extraction. 2.  Elevated LFTs, uptrending since ERCP, likely ampullary edema ERCP-mediated manipulation. 3.  Gallstones.  Plan:  1.  Follow LFTs. 2.  Clear liquid diet today, NPO after midnight. 3.  Plan for cholecystectomy +/- IOC tomorrow.   Freddy Jaksch 05/13/2015, 11:51 AM   Pager [phone] If no answer or after 5 PM call [phone]

## 2015-05-13 NOTE — Progress Notes (Signed)
Patient ID: Bruce Alvarez, male   DOB: Feb 19, 1971, 44 y.o.   MRN: [id number] 1 Day Post-Op  Subjective: Pt doesn't feel great this morning due to circumstances, but his abdomen doesn't really hurt.  Objective: Vital signs in last 24 hours: Temp:  [98.2 F (36.8 C)-99.6 F (37.6 C)] 99 F (37.2 C) (08/05 0629) Pulse Rate:  [89-98] 95 (08/05 0629) Resp:  [12-27] 17 (08/05 0133) BP: (112-156)/(47-100) 156/100 mmHg (08/05 0629) SpO2:  [91 %-99 %] 98 % (08/05 0629) Last BM Date: 05/10/15  Intake/Output from previous day: 08/04 0701 - 08/05 0700 In: 1320 [P.O.:220; I.V.:1100] Out: -  Intake/Output this shift:    PE: Abd: soft, Nt, ND, morbidly obese, +BS Heart: regular Lungs: CTAB  Lab Results:   Recent Labs  05/12/15 0351 05/13/15 0035  WBC 12.0* 7.1  HGB 17.0 14.7  HCT 50.1 44.3  PLT 294 175   BMET  Recent Labs  05/12/15 0351 05/13/15 0035  NA 134* 133*  K 4.9 4.1  CL 95* 95*  CO2 28 28  GLUCOSE 234* 171*  BUN 9 10  CREATININE 1.37* 1.39*  CALCIUM 8.6* 8.0*   PT/INR No results for input(s): LABPROT, INR in the last 72 hours. CMP     Component Value Date/Time   NA 133* 05/13/2015 0035   K 4.1 05/13/2015 0035   CL 95* 05/13/2015 0035   CO2 28 05/13/2015 0035   GLUCOSE 171* 05/13/2015 0035   BUN 10 05/13/2015 0035   CREATININE 1.39* 05/13/2015 0035   CALCIUM 8.0* 05/13/2015 0035   PROT 6.6 05/13/2015 0035   ALBUMIN 3.4* 05/13/2015 0035   AST 289* 05/13/2015 0035   ALT 360* 05/13/2015 0035   ALKPHOS 146* 05/13/2015 0035   BILITOT 6.4* 05/13/2015 0035   GFRNONAA 60* 05/13/2015 0035   GFRAA >60 05/13/2015 0035   Lipase     Component Value Date/Time   LIPASE 19* 05/13/2015 0035       Studies/Results: US Abdomen Limited  05/11/2015   CLINICAL DATA:  Epigastric pain  EXAM: US ABDOMEN LIMITED - RIGHT UPPER QUADRANT  COMPARISON:  MRCP 04/02/10  FINDINGS: Gallbladder:  Contracted gallbladder with multiple gallstones. There is mild thickening of  gallbladder wall up to 3.2 mm. No sonographic Murphy's sign.  Common bile duct:  Diameter: There is distended CBD up to 10.8 mm. Probable stone within CBD measures at least 1.2 cm. Further correlation with ERCP or MRCP could be performed.  Liver:  There is diffuse increased echogenicity of the liver. Mild intrahepatic biliary ductal dilatation. No focal hepatic mass.  IMPRESSION: Contracted gallbladder. Multiple gallstones are noted within gallbladder. There is CBD dilatation up to 10.8 mm. Probable stone within CBD measures at least 1.2 cm. Diffuse increased echogenicity of the liver consistent with fatty infiltration. Mild intrahepatic biliary ductal dilatation.  These results were called by telephone at the time of interpretation on 05/11/2015 at 12:08 pm to Dr. Gerhard Munch , who verbally acknowledged these results.   Electronically Signed   By: Natasha Mead M.D.   On: 05/11/2015 12:08   Dg Ercp With Sphincterotomy  05/12/2015   CLINICAL DATA:  44 year old male with choledocholithiasis  EXAM: ERCP  TECHNIQUE: Multiple spot images obtained with the fluoroscopic device and submitted for interpretation post-procedure.  FLUOROSCOPY TIME:  7 minutes 17 seconds reported. Please see GI operative note for further detail.  COMPARISON:  Abdominal ultrasound 05/11/2015  FINDINGS: Two intraoperative spot images demonstrate a flexible endoscope in the descending duodenum and cannulation of  the common bile duct. There is mild biliary ductal dilatation. The cystic duct is patent. Multiple filling defects are present within the gallbladder consistent with cholelithiasis. The second image demonstrates a sphincterotomy device in place.  IMPRESSION: ERCP with sphincterotomy as above.  Cholelithiasis with patent cystic duct.  These images were submitted for radiologic interpretation only. Please see the procedural report for the amount of contrast and the fluoroscopy time utilized.   Electronically Signed   By: Malachy Moan  M.D.   On: 05/12/2015 15:50    Anti-infectives: Anti-infectives    Start     Dose/Rate Route Frequency Ordered Stop   05/12/15 1200  ciprofloxacin (CIPRO) IVPB 400 mg  Status:  Discontinued     400 mg 200 mL/hr over 60 Minutes Intravenous  Once 05/12/15 1153 05/12/15 1455       Assessment/Plan  1. Choledocholithiasis -plan for lap chole tomorrow -troponin increased to 0.19, but trending down back.  Cards saw yesterday prior to these lab values but cleared him yesterday at low risk.  The prior elevations were likely secondary to demand ischemia.  Would like them to see again today since troponins bumped after his procedure -clear liquids today, NPO p MN   LOS: 2 days    Kyl Givler E 05/13/2015, 8:39 AM Pager: [phone]

## 2015-05-13 NOTE — Progress Notes (Signed)
Internal Medicine Attending  Date: 05/13/2015  Patient name: Bruce Alvarez Medical record number: [id number] Date of birth: [birth year] Age: 44 y.o. Gender: male  I saw and evaluated the patient. I discussed Bruce Alvarez case in detail with both Dr. Andrey Campanile and Bruce Alvarez.  Please see Dr. Tawana Scale progress note for details.  Surgery requested Bruce Alvarez be reassessed by Cardiology.  This is being done today.  If they feel he remains a low risk for a lap chole he may undergo the procedure tomorrow.  If they feel further cardiac evaluation is necessary this work-up will be done tomorrow, if possible.  If the plan is to do surgery at a later date and no further cardiac evaluation is felt to be necessary pre-operatively, he will be discharged home tomorrow to follow-up as an outpatient for an elective procedure.  He was feeling anxious this AM.  He will be started on the CIWA protocol given his history and symptoms.

## 2015-05-13 NOTE — Progress Notes (Signed)
     SUBJECTIVE: No chest pain or SOB  BP 150/87 mmHg  Pulse 88  Temp(Src) 98.5 F (36.9 C) (Oral)  Resp 17  Ht 5\' 4"  (1.626 m)  Wt 270 lb (122.471 kg)  BMI 46.32 kg/m2  SpO2 97%  Intake/Output Summary (Last 24 hours) at 05/13/15 1602 Last data filed at 05/12/15 2139  Gross per 24 hour  Intake    220 ml  Output      0 ml  Net    220 ml    PHYSICAL EXAM General: Well developed, well nourished, in no acute distress. Alert and oriented x 3.  Psych:  Good affect, responds appropriately Neck: No JVD. No masses noted.  Lungs: Clear bilaterally with no wheezes or rhonci noted.  Heart: RRR with no murmurs noted. Abdomen: Bowel sounds are present.  Extremities: No lower extremity edema.   LABS: Basic Metabolic Panel:  Recent Labs  87/68/11 0351 05/13/15 0035  NA 134* 133*  K 4.9 4.1  CL 95* 95*  CO2 28 28  GLUCOSE 234* 171*  BUN 9 10  CREATININE 1.37* 1.39*  CALCIUM 8.6* 8.0*   CBC:  Recent Labs  05/11/15 1015 05/12/15 0351 05/13/15 0035  WBC 7.0 12.0* 7.1  NEUTROABS 5.2  --  5.5  HGB 17.1* 17.0 14.7  HCT 50.0 50.1 44.3  MCV 98.8 102.5* 102.8*  PLT 197 294 175   Cardiac Enzymes:  Recent Labs  05/12/15 1630 05/12/15 2105 05/13/15 0035  TROPONINI 0.19* 0.18* 0.14*   Fasting Lipid Panel:  Recent Labs  05/13/15 0035  CHOL 195  HDL 50  LDLCALC 120*  TRIG 124  CHOLHDL 3.9    Current Meds: . antiseptic oral rinse  7 mL Mouth Rinse q12n4p  . carvedilol  12.5 mg Oral BID WC  . chlorhexidine  15 mL Mouth Rinse BID  . fentaNYL (SUBLIMAZE) injection  50 mcg Intravenous Once  . heparin  5,000 Units Subcutaneous 3 times per day     ASSESSMENT AND PLAN:  1. Elevated troponin: Troponin elevated pre and post procedure, now trending down from peak of 0.19. I do not think this is representative of ACS. He has no chest pain or SOB. Likely due to demand ischemia in setting of elevated BP in midst of severe pain. Will arrange echo tomorrow to assess  LVEF, exclude wall motion abnormalities.   MCALHANY,CHRISTOPHER  8/5/20164:02 PM

## 2015-05-13 NOTE — Discharge Summary (Signed)
Name: Bruce Alvarez MRN: [id number] DOB: 1970-10-30 44 y.o. PCP: Dorothey Baseman, MD  Date of Admission: 05/11/2015  9:52 AM Date of Discharge: 05/17/2015 Attending Physician: Doneen Poisson, MD  Discharge Diagnosis: 1. Choledocholithiasis 2. Nonischemic cardiomyopathy  3. Suspected sleep apnea 4. Acute kidney injury 5. Suspected diabetes mellitus  Discharge Medications:   Medication List    STOP taking these medications        losartan-hydrochlorothiazide 100-25 MG per tablet  Commonly known as:  HYZAAR     MILK THISTLE PO     testosterone cypionate 200 MG/ML injection  Commonly known as:  DEPOTESTOSTERONE CYPIONATE      TAKE these medications        amLODipine 5 MG tablet  Commonly known as:  NORVASC  Take 1 tablet (5 mg total) by mouth daily.     carvedilol 25 MG tablet  Commonly known as:  COREG  Take 1 tablet (25 mg total) by mouth 2 (two) times daily with a meal.     furosemide 40 MG tablet  Commonly known as:  LASIX  Take 1 tablet (40 mg total) by mouth 2 (two) times daily.     losartan 100 MG tablet  Commonly known as:  COZAAR  Take 1 tablet (100 mg total) by mouth daily.     multivitamin with minerals Tabs tablet  Take 1 tablet by mouth daily.     potassium chloride SA 20 MEQ tablet  Commonly known as:  K-DUR,KLOR-CON  Take 1 tablet (20 mEq total) by mouth 2 (two) times daily.        Disposition and follow-up:   Mr.Naythen Langland was discharged from [facility] in Good condition.  At the hospital follow up visit please address:  1.  Congestive Dilated Cardiomyopathy, assess fluid status and diuresis 2.  Hypertension, assess BP management in the setting of CHF 3.  Alcohol withdrawal, assess anxiety and VS stability 4.  Cholelithiasis, assess for symptoms of recurrent blockage/infection and candidacy for cholecystectomy 5.  s/p ERCP, monitor for post-ERCP pacreatitis  6.  Labs / imaging needed at time of follow-up: CBC, LFTs, SCr,  K  7.  Pending labs / test needing follow-up: ANA, SPEP, ACE  Follow-up Appointments: Follow-up Information    Follow up with Terance Hart, DAVID, MD. Schedule an appointment as soon as possible for a visit in 1 week.   Specialty:  Family Medicine   Why:  For hospital follow up and to manage diabetes   Contact information:   [address] [place] [address] [phone]       Follow up with Emelia Loron, MD. Call in 3 weeks.   Specialty:  General Surgery   Why:  For post-hospital follow up to arrange elective laparoscopic cholecystectomy.   Contact information:   [address] [place] [address] [phone]       Follow up with Ronie Spies, PA-C On 05/25/2015.   Specialties:  Cardiology, Radiology   Why:  @ 8am; [facility] for follow up of your heart problems   Contact information:   [address] [place] [address] [phone]       Discharge Instructions:  You were hospitalized for management of your gall stones. It was noted that your bile duct was obstructed by a large gall stone, which was causing your pain and other symptoms. This stone was removed by GI using an endoscopic procedure. You will follow up with the surgery team in  2-3 weeks to have your gall bladder removed in order to prevent a recurrence of this problem.  While you were in the hospital, you were found to have an abnormality of your heart. We preformed several tests, including an ultrasound of the heart and a catheterization of the heart, in order to investigate these issues. It was discovered that your heart's ability to pump is significantly decreased. It is currently only pumping at 25-30% efficiency which is less than half of the normal function. The catheterization revealed no signs of blockage in your arteries. As a result, it is most likely that your heart has been damaged by your hight blood pressures.  This condition, known as cardiomyopathy and  heart failure, is very significant. With reduced pumping, your heart is at significant risk of an abnormal rhythm, which can cause death. Without treatment, you have a 50% chance death in the next two years. As a result, it is critical that you work with your doctors to address these problems and are compliant with your medications.  There are several important things that you can do to improve your heart function: 1)  Stop drinking alcohol - alcohol can raise your blood pressure and damage your heart further 2)  Begin to exercise regularly - 30 minutes of activity 5 times each week will significantly increase your heart strength 3)  Change your diet - cut back on fats and salt heavy food, as these can contribute to heart disease and raise your blood pressure  We will also be changing your medications to lower your blood pressure and protect your heart: 1) Stop taking Hyzaar (losartan/HCTZ 100-25mg ), start taking Losartan 100mg  daily 2) Start taking Coreg (carvedilol) 25mg  two times a day 3) Start taking Lasix (furosemide) 40mg  two times a day, you will also take a potassium supplement two times a day to replenish your potassium  Consultations:  Cardiology Gastroenterology General Surgery  Procedures Performed:  Dg Chest 2 View  05/16/2015   CLINICAL DATA:  44 year old male with CHF and cardiomyopathy  EXAM: CHEST  2 VIEW  COMPARISON:  Prior chest x-ray 10/29/2013 (report only)  FINDINGS: Cardiomegaly. Mild pulmonary vascular congestion without overt edema. Trace bilateral pleural effusions. No focal airspace consolidation. Osseous structures are intact and unremarkable.  IMPRESSION: 1. Cardiomegaly and mild pulmonary vascular congestion without overt pulmonary edema or evidence of failure. 2. Trace bilateral pleural effusions.   Electronically Signed   By: Malachy Moan M.D.   On: 05/16/2015 18:36   US Abdomen Limited  05/11/2015   CLINICAL DATA:  Epigastric pain  EXAM: US ABDOMEN LIMITED -  RIGHT UPPER QUADRANT  COMPARISON:  MRCP 04/02/10  FINDINGS: Gallbladder:  Contracted gallbladder with multiple gallstones. There is mild thickening of gallbladder wall up to 3.2 mm. No sonographic Murphy's sign.  Common bile duct:  Diameter: There is distended CBD up to 10.8 mm. Probable stone within CBD measures at least 1.2 cm. Further correlation with ERCP or MRCP could be performed.  Liver:  There is diffuse increased echogenicity of the liver. Mild intrahepatic biliary ductal dilatation. No focal hepatic mass.  IMPRESSION: Contracted gallbladder. Multiple gallstones are noted within gallbladder. There is CBD dilatation up to 10.8 mm. Probable stone within CBD measures at least 1.2 cm. Diffuse increased echogenicity of the liver consistent with fatty infiltration. Mild intrahepatic biliary ductal dilatation.  These results were called by telephone at the time of interpretation on 05/11/2015 at 12:08 pm to Dr. Gerhard Munch , who verbally acknowledged these results.  Electronically Signed   By: Natasha Mead M.D.   On: 05/11/2015 12:08   Dg Ercp With Sphincterotomy  05/12/2015   CLINICAL DATA:  44 year old male with choledocholithiasis  EXAM: ERCP  TECHNIQUE: Multiple spot images obtained with the fluoroscopic device and submitted for interpretation post-procedure.  FLUOROSCOPY TIME:  7 minutes 17 seconds reported. Please see GI operative note for further detail.  COMPARISON:  Abdominal ultrasound 05/11/2015  FINDINGS: Two intraoperative spot images demonstrate a flexible endoscope in the descending duodenum and cannulation of the common bile duct. There is mild biliary ductal dilatation. The cystic duct is patent. Multiple filling defects are present within the gallbladder consistent with cholelithiasis. The second image demonstrates a sphincterotomy device in place.  IMPRESSION: ERCP with sphincterotomy as above.  Cholelithiasis with patent cystic duct.  These images were submitted for radiologic interpretation  only. Please see the procedural report for the amount of contrast and the fluoroscopy time utilized.   Electronically Signed   By: Malachy Moan M.D.   On: 05/12/2015 15:50    Admission HPI:  Mr. Adriano Bischof is a 44 y.o. male with a h/o of HTN, hypo-testosterone, and anxiety who presents with severe midepigastric pain since 2AM this morning. He reports that he awoke from sleep at 2AM with severe stomach pain, but without any nausea/vomitting, or diarrhea/constipation. He endorses having eaten a dinner of pork roast around 9pm the night before, and endorses a mildly decreased appetite during the previous day. He reports that he forced himself to vomit once this AM, but had no relief of his symptoms. He notes that he has had 1 similar episode in 2011 which was characterized by jaundice and and evaluation for GB stones, but this episode was less severe in terms of pain and self-limited, as a result, cholecystectomy was not pursued at that time. He underwent an investigation for autoimmune hepatits, which was not significant except for a trace elevation in ANA. His troponins were 0.04 x2 in the ED and EKG was unremarkable for CAD/ischemia.  He reports that his pain is 10/10 and constant, causing him to move around and unable to get comfortable. He denies any changes in bowel habits, bloody stools, or diarrhea. He denies pain with urination, but does note a change in the color of his urine (dark) x 1 wk which he attributes to some dehydration. He denies fevers or chills, but endorses sweats related to his severe pain. He denies CP or SOB, but endorses the inability to breathe deeply due to significant epigastric pain with inspiration. He did not attempt any remedy except the pain medications he received in the ED. He endorses alcohol consumption of "several fingers" of brandy every day, and endorses drinking more on the weekends. He smokes several cigars each week, as he is the owner of a cigar shop.  He  denies any family history of liver disease, but does note some family history of CAD with MI in his father at 24y.o. And MI in his grandfather in his 16s.  Hospital Course by problem list:  1. Choledocolithiasis: The patient presented with extreme colic-like pain localizing to the mid epigastric region. The pain was acute onset several hours following a fatty meal, and was not associated with nausea/vomitting or diarrhea. He has a history of presumed choledocolithiasis in 2011 w/o observed ductal stone and recieved no surgical management at that time. On this admission, it was noted that his transaminases were elevated and his total bilirubin continued to rise on serial testing  from 2.4 --> 4.8 --> 6.4. He remained non-jaundiced, non-icteric, and afeb throughout his hospitalization, but developed a mild leukocytosis on HD#2 which resolved by the time of discharge. Following his ERCP, his transaminases and bilirubin continued to rise for 2 days before beginning to trend downward. He was in significant pain on presentation, resulting in elevated blood pressures in the 220s systolic. His pain was treated aggressively with IV analgesics and his hypertension initially resolved with adequate pain control. At the time of discharge, he denied any abdominal pain or tenderness.  He received an abdominal ultrasound which demonstrated cholelithiasis and a 1.2cm common bile duct stone likely causing obstruction. Gastroenterology was consulted and an ERCP was preformed. This procedure resulted in sphincterotomy and the removal of several stones from the common bile duct ranging 2-10 mm. The procedure note documents good bile flow following the procedure, and the patient remained comfortable without signs of post-ERCP pacreatitis for the remainder of his hospital stay. Surgery was consulted to evaluate candidacy for cholecystectomy. He was determined to be inappropriate for surgery during this admission due to a newly  discovered cardiomyopathy with reduced LV EF to 25-30%. Surgery plans to follow up after stabilization of his cardiac disease in 2-3 weeks for elective laparoscopic cholecystectomy. They recommend that he receive weekly LFT labs in the interim in order to assess for continued resolution of his transaminitis and elevated bilirubin.  2. Congestive Dilated Cardiomyopathy: The patient was noted to have a mild troponin leak of 0.03 - 0.06 on initial presentation. Initial ECG showed questionable isolated ST segment depression in lead II. Repeat troponins peaked 0.19 and were trending down, with resolution of ECG findings on repeat testing. Cardiology was consulted in order to provide surgical clearance from a cardiac perspective during their investigation, an echocardiogram was ordered which demonstrated dilated atria and right ventricle with left ventricular hypertrophy. Echo estimated LV EF at 25-30% with grade 1 diastolic dysfunction. These new findings prompted further investigation of coronary artery disease and a right heart catheterization was preformed on HD#5. Cath demonstrated elevated LV EDP and pulmonary hypertension. However, there was no evidence of coronary artery disease. An investigation of non-ischemic causes of cardiomyopathy was undertaken with thyroid studies, iron studies, HIV testing all of which were non-contributory. ANA, SPEP, and ACE laboratory studies are still pending at the time of discharge.  Given the patient's co-morbid poorly controlled hypertension, it was thought that his cardiomyopathy is most likely attributable to hypertension. Also on the differential are alcohol-associated, stress-induced, or idiopathic cardiomyopathy. Cardiology recommended treating his heart failure aggressively with diuresis and blood pressure control. His home BP medication was switched from losartan/HCTZ to losartan alone and carvedilol was added. Furosemide was added for diuresis. He was started on  potassium supplements and counseled on monitoring his sodium intake and measure his weight daily to assess for signs of fluid overload.  He was counseled on the importance of lifestyle modifications including alcohol cessation, smoking cessation, diet, and exercise. He was also counseled on the importance of compliance with his medications and on the significant risks and prognosis associated with his condition (50% 2-year mortality for untreated cardiomyopathy).  3. Suspected Obstructive Sleep Apnea: His complaints of excessive daytime sleepiness and episodes of nocturnal apnea witnessed by his wife are consistent with a diagnosis of OSA. His large neck and body habitus are also concerning for OSA. He has never been evaluated formally with nocturnal polysomnography, but would likely receive significant benefit from CPAP. Given his pulmonary hypertension, it is likely  that his cardiomyopathy is partially attributable to untreated sleep apnea. Additionally, the patient recently became compliant with his biweekly injections of testosterone. Testosterone has been shown to negatively impact OSA and we strongly recommend discontinuing this as its risks currently outweigh its benefits.  He will need polysomnography as an outpatient.   4. Acute Kidney Injury: At presentation, the patient's renal function was depressed from his baseline with a SCr of 1.22. His SCr continued to rise over serial testing and peaked at 1.39. It was thought that his AKI was likely secondary to dehydration in the setting of poor oral intake over the last week culminating in his acute illness. He has no history of kidney disease and his U/A was bland only significant for dark coloration. He was started on intravenous hydration with normal saline and his renal function began to improve. At the time of discharge his renal function had normalized and his SCr was 1.04. His IVF were discontinued after normalization of SCr.  5. Suspected  diabetes:  A1c was 6.8 at admission and the patient says he thinks it has been high in the past but is not sure of the exact value.  Ideally, he should have repeat A1c in a few weeks and after resolution of current acute illness to confirm diabetes.  In the meantime, he has been counseled on lifestyle modifications to reduce risk.  Discharge Vitals:   BP 173/92 mmHg  Pulse 77  Temp(Src) 97.6 F (36.4 C) (Oral)  Resp 18  Ht 5\' 4"  (1.626 m)  Wt 130.7 kg (288 lb 2.3 oz)  BMI 49.43 kg/m2  SpO2 98%  Discharge Labs:  Results for orders placed or performed during the hospital encounter of 05/11/15 (from the past 24 hour(s))  TSH     Status: None   Collection Time: 05/16/15  4:14 PM  Result Value Ref Range   TSH 1.161 0.350 - 4.500 uIU/mL  Iron and TIBC     Status: None   Collection Time: 05/16/15  4:14 PM  Result Value Ref Range   Iron 62 45 - 182 ug/dL   TIBC 322 025 - 427 ug/dL   Saturation Ratios 18 17.9 - 39.5 %   UIBC 274 ug/dL  Ferritin     Status: None   Collection Time: 05/16/15  4:14 PM  Result Value Ref Range   Ferritin 177 24 - 336 ng/mL  Rapid HIV screen (HIV 1/2 Ab+Ag)     Status: None   Collection Time: 05/16/15  4:14 PM  Result Value Ref Range   HIV-1 P24 Antigen - HIV24 NON REACTIVE NON REACTIVE   HIV 1/2 Antibodies NON REACTIVE NON REACTIVE   Interpretation (HIV Ag Ab)      A non reactive test result means that HIV 1 or HIV 2 antibodies and HIV 1 p24 antigen were not detected in the specimen.  Basic metabolic panel     Status: Abnormal   Collection Time: 05/17/15  5:50 AM  Result Value Ref Range   Sodium 134 (L) 135 - 145 mmol/L   Potassium 3.7 3.5 - 5.1 mmol/L   Chloride 101 101 - 111 mmol/L   CO2 27 22 - 32 mmol/L   Glucose, Bld 99 65 - 99 mg/dL   BUN 11 6 - 20 mg/dL   Creatinine, Ser 0.62 0.61 - 1.24 mg/dL   Calcium 8.5 (L) 8.9 - 10.3 mg/dL   GFR calc non Af Amer >60 >60 mL/min   GFR calc Af Amer >60 >60 mL/min  Anion gap 6 5 - 15  CBC     Status:  Abnormal   Collection Time: 05/17/15  5:50 AM  Result Value Ref Range   WBC 6.3 4.0 - 10.5 K/uL   RBC 4.77 4.22 - 5.81 MIL/uL   Hemoglobin 16.5 13.0 - 17.0 g/dL   HCT 16.1 09.6 - 04.5 %   MCV 99.4 78.0 - 100.0 fL   MCH 34.6 (H) 26.0 - 34.0 pg   MCHC 34.8 30.0 - 36.0 g/dL   RDW 40.9 81.1 - 91.4 %   Platelets 220 150 - 400 K/uL    Signed: Evelena Peat, DO  05/17/2015, 2:12 PM    Services Ordered on Discharge: None Equipment Ordered on Discharge: None

## 2015-05-14 ENCOUNTER — Inpatient Hospital Stay (HOSPITAL_COMMUNITY): Payer: Medicaid Other

## 2015-05-14 ENCOUNTER — Encounter (HOSPITAL_COMMUNITY): Payer: Self-pay | Admitting: Certified Registered"

## 2015-05-14 LAB — CBC
HCT: 46 % (ref 39.0–52.0)
Hemoglobin: 15.1 g/dL (ref 13.0–17.0)
MCH: 33.7 pg (ref 26.0–34.0)
MCHC: 32.8 g/dL (ref 30.0–36.0)
MCV: 102.7 fL — ABNORMAL HIGH (ref 78.0–100.0)
Platelets: 179 10*3/uL (ref 150–400)
RBC: 4.48 MIL/uL (ref 4.22–5.81)
RDW: 14.9 % (ref 11.5–15.5)
WBC: 6.8 10*3/uL (ref 4.0–10.5)

## 2015-05-14 LAB — LIPASE, BLOOD: Lipase: 18 U/L — ABNORMAL LOW (ref 22–51)

## 2015-05-14 LAB — BASIC METABOLIC PANEL
ANION GAP: 11 (ref 5–15)
BUN: 12 mg/dL (ref 6–20)
CALCIUM: 7.9 mg/dL — AB (ref 8.9–10.3)
CHLORIDE: 103 mmol/L (ref 101–111)
CO2: 22 mmol/L (ref 22–32)
CREATININE: 1.13 mg/dL (ref 0.61–1.24)
GFR calc non Af Amer: >60 mL/min (ref >60)
GLUCOSE: 117 mg/dL — AB (ref 65–99)
Potassium: 4.7 mmol/L (ref 3.5–5.1)
Sodium: 136 mmol/L (ref 135–145)

## 2015-05-14 LAB — COMPREHENSIVE METABOLIC PANEL
ALK PHOS: 162 U/L — AB (ref 38–126)
ALT: 403 U/L — ABNORMAL HIGH (ref 17–63)
ANION GAP: 7 (ref 5–15)
AST: 276 U/L — ABNORMAL HIGH (ref 15–41)
Albumin: 3.1 g/dL — ABNORMAL LOW (ref 3.5–5.0)
BILIRUBIN TOTAL: 7.3 mg/dL — AB (ref 0.3–1.2)
BUN: 10 mg/dL (ref 6–20)
CO2: 26 mmol/L (ref 22–32)
Calcium: 7.9 mg/dL — ABNORMAL LOW (ref 8.9–10.3)
Chloride: 101 mmol/L (ref 101–111)
Creatinine, Ser: 1.17 mg/dL (ref 0.61–1.24)
GFR calc Af Amer: >60 mL/min (ref >60)
GFR calc non Af Amer: >60 mL/min (ref >60)
Glucose, Bld: 146 mg/dL — ABNORMAL HIGH (ref 65–99)
POTASSIUM: 5.4 mmol/L — AB (ref 3.5–5.1)
SODIUM: 134 mmol/L — AB (ref 135–145)
Total Protein: 6.3 g/dL — ABNORMAL LOW (ref 6.5–8.1)

## 2015-05-14 LAB — HEMOGLOBIN A1C
Hgb A1c MFr Bld: 6.8 % — ABNORMAL HIGH (ref 4.8–5.6)
MEAN PLASMA GLUCOSE: 148 mg/dL

## 2015-05-14 LAB — SURGICAL PCR SCREEN
MRSA, PCR: NEGATIVE
Staphylococcus aureus: POSITIVE — AB

## 2015-05-14 MED ORDER — MUPIROCIN 2 % EX OINT
1.0000 "application " | TOPICAL_OINTMENT | Freq: Two times a day (BID) | CUTANEOUS | Status: DC
  Administered 2015-05-14 – 2015-05-17 (×6): 1 via NASAL
  Filled 2015-05-14 (×2): qty 22

## 2015-05-14 MED ORDER — PERFLUTREN LIPID MICROSPHERE
1.0000 mL | INTRAVENOUS | Status: AC | PRN
Start: 2015-05-14 — End: 2015-05-14
  Administered 2015-05-14: 2 mL via INTRAVENOUS
  Filled 2015-05-14: qty 10

## 2015-05-14 MED ORDER — 0.9 % SODIUM CHLORIDE (POUR BTL) OPTIME
TOPICAL | Status: DC | PRN
  Administered 2015-05-14: 1000 mL

## 2015-05-14 MED ORDER — CHLORHEXIDINE GLUCONATE CLOTH 2 % EX PADS
6.0000 | MEDICATED_PAD | Freq: Every day | CUTANEOUS | Status: DC
  Administered 2015-05-14 – 2015-05-17 (×3): 6 via TOPICAL

## 2015-05-14 MED ORDER — BUPIVACAINE-EPINEPHRINE (PF) 0.25% -1:200000 IJ SOLN
INTRAMUSCULAR | Status: DC | PRN
  Administered 2015-05-14: 12:00:00 via INTRAMUSCULAR

## 2015-05-14 MED ORDER — FENTANYL CITRATE (PF) 250 MCG/5ML IJ SOLN
INTRAMUSCULAR | Status: AC
  Filled 2015-05-14: qty 5

## 2015-05-14 MED ORDER — SODIUM CHLORIDE 0.9 % IV SOLN
INTRAVENOUS | Status: DC
  Administered 2015-05-15: 20:00:00 via INTRAVENOUS

## 2015-05-14 MED ORDER — SODIUM CHLORIDE 0.9 % IR SOLN
Status: DC | PRN
Start: 2015-05-14 — End: 2015-05-16
  Administered 2015-05-14: 1000 mL

## 2015-05-14 MED ORDER — SODIUM CHLORIDE 0.9 % IV SOLN: INTRAVENOUS | Status: DC

## 2015-05-14 MED ORDER — LIDOCAINE HCL (PF) 1 % IJ SOLN
INTRAMUSCULAR | Status: AC
  Filled 2015-05-14: qty 30

## 2015-05-14 MED ORDER — MIDAZOLAM HCL 2 MG/2ML IJ SOLN
INTRAMUSCULAR | Status: AC
  Filled 2015-05-14: qty 4

## 2015-05-14 MED ORDER — BUPIVACAINE-EPINEPHRINE (PF) 0.25% -1:200000 IJ SOLN
INTRAMUSCULAR | Status: AC
  Filled 2015-05-14: qty 30

## 2015-05-14 NOTE — Progress Notes (Signed)
[facility] [place] [facility] Progress Note  2 Days Post-Op  Subjective: Pt doing okay.  Says that pain meds and Ambien made him sweat profusely and was not able to rest.  No N/V, not much abdominal pain.  Pending Echo.  Having flatus and urinating well, no BM yet.  Objective: Vital signs in last 24 hours: Temp:  [97.7 F (36.5 C)-98.5 F (36.9 C)] 97.7 F (36.5 C) (08/06 0536) Pulse Rate:  [88-99] 99 (08/06 0536) Resp:  [17-20] 20 (08/06 0536) BP: (118-150)/(69-90) 118/69 mmHg (08/06 0536) SpO2:  [92 %-97 %] 95 % (08/06 0536) Last BM Date: 05/10/15  Intake/Output from previous day:   Intake/Output this shift:    PE: Gen:  Alert, NAD, pleasant Card:  RRR, no M/G/R heard Pulm:  CTA, no W/R/R Abd: Soft, NT/ND, +BS, no HSM, incisions C/D/I, drain with minimal sanguinous drainage, no abdominal scars noted   Lab Results:   Recent Labs  05/13/15 0035 05/14/15 0339  WBC 7.1 6.8  HGB 14.7 15.1  HCT 44.3 46.0  PLT 175 179   BMET  Recent Labs  05/13/15 0035 05/14/15 0339  NA 133* 134*  K 4.1 5.4*  CL 95* 101  CO2 28 26  GLUCOSE 171* 146*  BUN 10 10  CREATININE 1.39* 1.17  CALCIUM 8.0* 7.9*   PT/INR No results for input(s): LABPROT, INR in the last 72 hours. CMP     Component Value Date/Time   NA 134* 05/14/2015 0339   K 5.4* 05/14/2015 0339   CL 101 05/14/2015 0339   CO2 26 05/14/2015 0339   GLUCOSE 146* 05/14/2015 0339   BUN 10 05/14/2015 0339   CREATININE 1.17 05/14/2015 0339   CALCIUM 7.9* 05/14/2015 0339   PROT 6.3* 05/14/2015 0339   ALBUMIN 3.1* 05/14/2015 0339   AST 276* 05/14/2015 0339   ALT 403* 05/14/2015 0339   ALKPHOS 162* 05/14/2015 0339   BILITOT 7.3* 05/14/2015 0339   GFRNONAA >60 05/14/2015 0339   GFRAA >60 05/14/2015 0339   Lipase     Component Value Date/Time   LIPASE 18* 05/14/2015 0339       Studies/Results: Dg Ercp With Sphincterotomy  05/12/2015   CLINICAL DATA:  44 year old male with choledocholithiasis  EXAM: ERCP   TECHNIQUE: Multiple spot images obtained with the fluoroscopic device and submitted for interpretation post-procedure.  FLUOROSCOPY TIME:  7 minutes 17 seconds reported. Please see GI operative note for further detail.  COMPARISON:  Abdominal ultrasound 05/11/2015  FINDINGS: Two intraoperative spot images demonstrate a flexible endoscope in the descending duodenum and cannulation of the common bile duct. There is mild biliary ductal dilatation. The cystic duct is patent. Multiple filling defects are present within the gallbladder consistent with cholelithiasis. The second image demonstrates a sphincterotomy device in place.  IMPRESSION: ERCP with sphincterotomy as above.  Cholelithiasis with patent cystic duct.  These images were submitted for radiologic interpretation only. Please see the procedural report for the amount of contrast and the fluoroscopy time utilized.   Electronically Signed   By: Jacqulynn Cadet M.D.   On: 05/12/2015 15:50    Anti-infectives: Anti-infectives    Start     Dose/Rate Route Frequency Ordered Stop   05/12/15 1200  ciprofloxacin (CIPRO) IVPB 400 mg  Status:  Discontinued     400 mg 200 mL/hr over 60 Minutes Intravenous  Once 05/12/15 1153 05/12/15 1455       Assessment/Plan Choledocholithiasis s/p ERCP with sphincterotomy -Plan for lap chole today if cardiology is agreeable after Echo -  Currently NPO -GI following know that LFT's have increased, but may need repeat ERCP depending on results of IOC and LFT trends -Labs in AM Elevated troponin's -Cardiology following and do not think his elevated troponin's are due to ACS.  Likely demand ischemia in setting of elevated BP and severe pain.   -Pending Echo today, on call echo tech trying to get to him this am, but they are apparently behind Elevated Alk phos - 162 Hyperbilirubinemia - 7.3 today Transaminitis - ALT up today, AST down Hyperkalemia - per primary service    LOS: 3 days    Nat Christen 05/14/2015,  10:00 AM Pager: [phone]

## 2015-05-14 NOTE — Progress Notes (Signed)
Echo has not been read yet.  Unlikely to be acute coronary syndrome, but since echo ordered to assess LV, would not put him to sleep while awaiting read.  Will delay until tomorrow.

## 2015-05-14 NOTE — Progress Notes (Signed)
  Echocardiogram 2D Echocardiogram has been performed.  Bruce Alvarez 05/14/2015, 12:11 PM

## 2015-05-14 NOTE — Progress Notes (Signed)
Subjective: Bruce Alvarez was seen and examined this AM.  No N/V or abd pain.  He had four BMs yesterday, non bloody.   Last two were loose.  He says he got "loopy" last night which he thinks was due to combo of dilaudid and Ambien (he does not take Ambien at home).  He says he slept well last night.  Objective: Vital signs in last 24 hours: Filed Vitals:   05/13/15 0949 05/13/15 1309 05/13/15 2131 05/14/15 0536  BP: 146/93 150/87 150/90 118/69  Pulse: 95 88 96 99  Temp: 100.2 F (37.9 C) 98.5 F (36.9 C) 98.4 F (36.9 C) 97.7 F (36.5 C)  TempSrc: Oral Oral Oral Oral  Resp: Height:      Weight:      SpO2: 93% 97% 92% 95%   Weight change:  No intake or output data in the 24 hours ending 05/14/15 0752 General: laying flat in bed in NAD; intermittently doses off during our conversation but easily arousable HEENT:  [place]/AT, thick neck Cardiac: RRR, no rubs, murmurs or gallops Pulm: clear to auscultation bilaterally, moving normal volumes of air Abd: soft, nontender, nondistended, BS present Neuro: alert and oriented X3, responding appropriately, MAE spontaneously  Lab Results: Basic Metabolic Panel:  Recent Labs Lab 05/13/15 0035 05/14/15 0339  NA 133* 134*  K 4.1 5.4*  CL 95* 101  CO2 28 26  GLUCOSE 171* 146*  BUN 10 10  CREATININE 1.39* 1.17  CALCIUM 8.0* 7.9*   Liver Function Tests:  Recent Labs Lab 05/13/15 0035 05/14/15 0339  AST 289* 276*  ALT 360* 403*  ALKPHOS 146* 162*  BILITOT 6.4* 7.3*  PROT 6.6 6.3*  ALBUMIN 3.4* 3.1*    Recent Labs Lab 05/13/15 0035 05/14/15 0339  LIPASE 19* 18*   CBC:  Recent Labs Lab 05/11/15 1015  05/13/15 0035 05/14/15 0339  WBC 7.0  < > 7.1 6.8  NEUTROABS 5.2  --  5.5  --   HGB 17.1*  < > 14.7 15.1  HCT 50.0  < > 44.3 46.0  MCV 98.8  < > 102.8* 102.7*  PLT 197  < > 175 179  < > = values in this interval not displayed.  Medications: I have reviewed the patient's current medications. Scheduled  Meds: . antiseptic oral rinse  7 mL Mouth Rinse q12n4p  . carvedilol  12.5 mg Oral BID WC  . chlorhexidine  15 mL Mouth Rinse BID  . fentaNYL (SUBLIMAZE) injection  50 mcg Intravenous Once  . heparin  5,000 Units Subcutaneous 3 times per day   Continuous Infusions: none . sodium chloride 125 mL/hr at 05/14/15 0411   PRN Meds:.acetaminophen, hydrALAZINE, HYDROmorphone (DILAUDID) injection, ondansetron **OR** ondansetron (ZOFRAN) IV, zolpidem   Assessment/Plan: 44 year old male with PMH of HTN, low testosterone presents with abdominal pain and found to have CBD stone.  Choledocholithiasis:  He presented with abdominal pain and [place] evidence of CBD stone.  Elevated LFTs, ALP 115 --> 162, tbili 2.4 --> 7.3, s/p ERCP.  Appreciated GI and surgical interventions and recommendations.  Patient agreeable to lap chole.  He understands we are waiting on 2D ECHO to complete cardiac eval prior to going to OR.  He feels well enough to go home and return if no immediate intervention planned.   - plan is for lap chole and patient is currently NPO, however, 2D ECHO unable to be performed until this afternoon (I spoke with tech) so very unlikely  that patient will have surgery today - will contact surgery to determine if they want him to stay in hospital and plan for surgery tomorrow, or if surgery early next week, perhaps he can be discharged home this afternoon provided cardiac work-up neg. - will resume clear liquid diet and ADAT if no surgery planned for today - continue to trend CMP - continue pain control with IV dilaudid (pain currently well controlled) --> switch to oral Vicodin if he is being discharged  Elevated troponin:  Peaked at 0.19.  Clinical picture not c/w ACS.  Appreciate cardiology recommendations.    - awaiting 2D ECHO this afternoon to exclude WMA - if no further work-up per cards, he may be able to d/c depending on surgery plans (OR tomorrow vs next week?)  AKI:  Patient reports decrease  po in recent days so this is likely pre-renal.  He tolerated liquids but currently NPO in anticipation of surgery. - continue NS  - hold losartan-HCTZ - monitor renal function  Hyperkalemia:  K slightly elevated at 5.4.  Renal function improving.  Unclear etiology.  Repeat BMP.  Hypertension:  Stable. Holding home losartan-HCTZ in the setting of AKI. - Coreg 12.5mg  BID started this admission for BP control - prn hydral for SBP > 190/110 - pain control as above  Likely OSA:  Patient's hgb 17.1 at admission.  He has a large neck circumference and wife says he snores and often has apneic periods while sleeping but has not had formal OSA dx nor does he have a CPAP.  He is intermittently falling asleep and snoring this AM.   - advise sleep study at discharge  Hyperglycemia/probable diabetes:  No DM dx but patient says recent A1c elevated.  Hgb A1c is 6.8.  Patient is likely diabetic.  Ideally, would need a confirmatory A1c several days from now to confirm.  Will ask him if he know what the last elevated A1c number was (unknown if it was pre-DM range or DM range). - follow-up with PCP after d/c - will need to make lifestyle changes and consider starting metformin  Dispo: Disposition is deferred at this time, awaiting improvement of current medical problems.  Anticipated discharge in approximately 0-2 day(s).   The patient does have a current PCP Dorothey Baseman, MD) and does not need an [facility] hospital follow-up appointment after discharge.  The patient does not know have transportation limitations that hinder transportation to clinic appointments.  .Services Needed at time of discharge: Y = Yes, Blank = No PT:   OT:   RN:   Equipment:   Other:     LOS: 3 days   Yolanda Manges, DO 05/14/2015, 7:52 AM

## 2015-05-14 NOTE — Progress Notes (Signed)
[facility] Progress Note  Subjective: Patient sound asleep, I did not disturb. Unfortunately LFTs continue to rise.  Objective: Vital signs in last 24 hours: Temp:  [97.7 F (36.5 C)-100.2 F (37.9 C)] 97.7 F (36.5 C) (08/06 0536) Pulse Rate:  [88-99] 99 (08/06 0536) Resp:  [17-20] 20 (08/06 0536) BP: (118-150)/(69-93) 118/69 mmHg (08/06 0536) SpO2:  [92 %-97 %] 95 % (08/06 0536) Weight change:    PE: Not done  Lab Results: Results for orders placed or performed during the hospital encounter of 05/11/15 (from the past 24 hour(s))  Comprehensive metabolic panel Once     Status: Abnormal   Collection Time: 05/14/15  3:39 AM  Result Value Ref Range   Sodium 134 (L) 135 - 145 mmol/L   Potassium 5.4 (H) 3.5 - 5.1 mmol/L   Chloride 101 101 - 111 mmol/L   CO2 26 22 - 32 mmol/L   Glucose, Bld 146 (H) 65 - 99 mg/dL   BUN 10 6 - 20 mg/dL   Creatinine, Ser 1.61 0.61 - 1.24 mg/dL   Calcium 7.9 (L) 8.9 - 10.3 mg/dL   Total Protein 6.3 (L) 6.5 - 8.1 g/dL   Albumin 3.1 (L) 3.5 - 5.0 g/dL   AST 096 (H) 15 - 41 U/L   ALT 403 (H) 17 - 63 U/L   Alkaline Phosphatase 162 (H) 38 - 126 U/L   Total Bilirubin 7.3 (H) 0.3 - 1.2 mg/dL   GFR calc non Af Amer >60 >60 mL/min   GFR calc Af Amer >60 >60 mL/min   Anion gap 7 5 - 15  Lipase, blood     Status: Abnormal   Collection Time: 05/14/15  3:39 AM  Result Value Ref Range   Lipase 18 (L) 22 - 51 U/L  CBC     Status: Abnormal   Collection Time: 05/14/15  3:39 AM  Result Value Ref Range   WBC 6.8 4.0 - 10.5 K/uL   RBC 4.48 4.22 - 5.81 MIL/uL   Hemoglobin 15.1 13.0 - 17.0 g/dL   HCT 04.5 40.9 - 81.1 %   MCV 102.7 (H) 78.0 - 100.0 fL   MCH 33.7 26.0 - 34.0 pg   MCHC 32.8 30.0 - 36.0 g/dL   RDW 91.4 78.2 - 95.6 %   Platelets 179 150 - 400 K/uL    Studies/Results: Dg Ercp With Sphincterotomy  05/12/2015   CLINICAL DATA:  44 year old male with choledocholithiasis  EXAM: ERCP  TECHNIQUE: Multiple spot images obtained with the  fluoroscopic device and submitted for interpretation post-procedure.  FLUOROSCOPY TIME:  7 minutes 17 seconds reported. Please see GI operative note for further detail.  COMPARISON:  Abdominal ultrasound 05/11/2015  FINDINGS: Two intraoperative spot images demonstrate a flexible endoscope in the descending duodenum and cannulation of the common bile duct. There is mild biliary ductal dilatation. The cystic duct is patent. Multiple filling defects are present within the gallbladder consistent with cholelithiasis. The second image demonstrates a sphincterotomy device in place.  IMPRESSION: ERCP with sphincterotomy as above.  Cholelithiasis with patent cystic duct.  These images were submitted for radiologic interpretation only. Please see the procedural report for the amount of contrast and the fluoroscopy time utilized.   Electronically Signed   By: Malachy Moan M.D.   On: 05/12/2015 15:50      Assessment: Cholelithiasis and choledocholithiasis status post apparently successful ERCP but with rising liver function tests.  Plan: Laparoscopic cholecystectomy with intraoperative cholangiogram by surgery whenever cleared by cardiology. Will  follow-up on results to see if possible repeat ERCP with extension of sphincterotomy might be needed.    Roselynn Whitacre C 05/14/2015, 8:26 AM  Pager [phone] If no answer or after 5 PM call [phone]

## 2015-05-15 ENCOUNTER — Encounter (HOSPITAL_COMMUNITY): Payer: Self-pay | Admitting: Certified Registered"

## 2015-05-15 ENCOUNTER — Encounter (HOSPITAL_COMMUNITY)
Admission: EM | Disposition: A | Discharge status: Home / Self Care | Payer: Self-pay | Source: Home / Self Care | Attending: Emergency Medicine

## 2015-05-15 DIAGNOSIS — I428 Other cardiomyopathies: Secondary | ICD-10-CM

## 2015-05-15 DIAGNOSIS — I420 Dilated cardiomyopathy: Secondary | ICD-10-CM

## 2015-05-15 LAB — CBC
HEMATOCRIT: 44.3 % (ref 39.0–52.0)
Hemoglobin: 14.8 g/dL (ref 13.0–17.0)
MCH: 34.1 pg — ABNORMAL HIGH (ref 26.0–34.0)
MCHC: 33.4 g/dL (ref 30.0–36.0)
MCV: 102.1 fL — AB (ref 78.0–100.0)
Platelets: 197 10*3/uL (ref 150–400)
RBC: 4.34 MIL/uL (ref 4.22–5.81)
RDW: 15.1 % (ref 11.5–15.5)
WBC: 6.3 10*3/uL (ref 4.0–10.5)

## 2015-05-15 LAB — COMPREHENSIVE METABOLIC PANEL
ALK PHOS: 171 U/L — AB (ref 38–126)
ALT: 457 U/L — ABNORMAL HIGH (ref 17–63)
AST: 246 U/L — ABNORMAL HIGH (ref 15–41)
Albumin: 3.1 g/dL — ABNORMAL LOW (ref 3.5–5.0)
Anion gap: 8 (ref 5–15)
BUN: 13 mg/dL (ref 6–20)
CALCIUM: 7.8 mg/dL — AB (ref 8.9–10.3)
CO2: 26 mmol/L (ref 22–32)
Chloride: 100 mmol/L — ABNORMAL LOW (ref 101–111)
Creatinine, Ser: 1.01 mg/dL (ref 0.61–1.24)
GFR calc Af Amer: >60 mL/min (ref >60)
Glucose, Bld: 114 mg/dL — ABNORMAL HIGH (ref 65–99)
Potassium: 4.3 mmol/L (ref 3.5–5.1)
Sodium: 134 mmol/L — ABNORMAL LOW (ref 135–145)
TOTAL PROTEIN: 6.3 g/dL — AB (ref 6.5–8.1)
Total Bilirubin: 3.7 mg/dL — ABNORMAL HIGH (ref 0.3–1.2)

## 2015-05-15 SURGERY — LAPAROSCOPIC CHOLECYSTECTOMY WITH INTRAOPERATIVE CHOLANGIOGRAM
Anesthesia: General

## 2015-05-15 MED ORDER — HYDRALAZINE HCL 20 MG/ML IJ SOLN
10.0000 mg | INTRAMUSCULAR | Status: DC | PRN
  Administered 2015-05-15: 10 mg via INTRAVENOUS
  Filled 2015-05-15: qty 1

## 2015-05-15 MED ORDER — ASPIRIN 81 MG PO CHEW
81.0000 mg | CHEWABLE_TABLET | ORAL | Status: AC
  Administered 2015-05-16: 81 mg via ORAL
  Filled 2015-05-15: qty 1

## 2015-05-15 MED ORDER — SODIUM CHLORIDE 0.9 % IV SOLN: 250.0000 mL | INTRAVENOUS | Status: DC | PRN

## 2015-05-15 MED ORDER — SODIUM CHLORIDE 0.9 % IJ SOLN: 3.0000 mL | INTRAMUSCULAR | Status: DC | PRN

## 2015-05-15 MED ORDER — HYDRALAZINE HCL 20 MG/ML IJ SOLN
10.0000 mg | Freq: Once | INTRAMUSCULAR | Status: AC
  Administered 2015-05-15: 10 mg via INTRAVENOUS
  Filled 2015-05-15: qty 1

## 2015-05-15 MED ORDER — SODIUM CHLORIDE 0.9 % IV SOLN: INTRAVENOUS | Status: DC

## 2015-05-15 MED ORDER — SODIUM CHLORIDE 0.9 % IJ SOLN
3.0000 mL | Freq: Two times a day (BID) | INTRAMUSCULAR | Status: DC
  Administered 2015-05-15: 3 mL via INTRAVENOUS

## 2015-05-15 NOTE — Progress Notes (Signed)
[facility] Progress Note  Day of Surgery  Subjective: Pt doing okay, pain improved.  NPO.  Concerned about his heart.  Waiting for cardiology.  Objective: Vital signs in last 24 hours: Temp:  [98.3 F (36.8 C)-99 F (37.2 C)] 99 F (37.2 C) (08/07 0541) Pulse Rate:  [85-94] 85 (08/07 0541) Resp:  [18-19] 18 (08/07 0541) BP: (147-192)/(92-138) 167/115 mmHg (08/07 0541) SpO2:  [93 %-95 %] 95 % (08/07 0541) Last BM Date: 05/10/15  Intake/Output from previous day: 08/06 0701 - 08/07 0700 In: 3494.2 [P.O.:240; I.V.:3254.2] Out: 550 [Urine:550] Intake/Output this shift: Total I/O In: 240 [P.O.:240] Out: -   PE: Gen: Alert, NAD, pleasant Neck:  Short Card: RRR, no M/G/R heard Pulm: CTA, no W/R/R, heavy breath sounds Abd: Extreme obesity Body mass index is 46.32 kg/(m^2).  Soft, mild distension, NT, +BS  Lab Results:   Recent Labs  05/14/15 0339 05/15/15 0545  WBC 6.8 6.3  HGB 15.1 14.8  HCT 46.0 44.3  PLT 179 197   BMET  Recent Labs  05/14/15 1230 05/15/15 0545  NA 136 134*  K 4.7 4.3  CL 103 100*  CO2 22 26  GLUCOSE 117* 114*  BUN 12 13  CREATININE 1.13 1.01  CALCIUM 7.9* 7.8*   PT/INR No results for input(s): LABPROT, INR in the last 72 hours. CMP     Component Value Date/Time   NA 134* 05/15/2015 0545   K 4.3 05/15/2015 0545   CL 100* 05/15/2015 0545   CO2 26 05/15/2015 0545   GLUCOSE 114* 05/15/2015 0545   BUN 13 05/15/2015 0545   CREATININE 1.01 05/15/2015 0545   CALCIUM 7.8* 05/15/2015 0545   PROT 6.3* 05/15/2015 0545   ALBUMIN 3.1* 05/15/2015 0545   AST 246* 05/15/2015 0545   ALT 457* 05/15/2015 0545   ALKPHOS 171* 05/15/2015 0545   BILITOT 3.7* 05/15/2015 0545   GFRNONAA >60 05/15/2015 0545   GFRAA >60 05/15/2015 0545   Lipase     Component Value Date/Time   LIPASE 18* 05/14/2015 0339       Studies/Results: No results found.  Anti-infectives: Anti-infectives    Start     Dose/Rate Route Frequency  Ordered Stop   05/12/15 1200  ciprofloxacin (CIPRO) IVPB 400 mg  Status:  Discontinued     400 mg 200 mL/hr over 60 Minutes Intravenous  Once 05/12/15 1153 05/12/15 1455       Assessment/Plan Choledocholithiasis s/p ERCP with sphincterotomy -Echo shoed EF of 25%, await cardiology recommendations, but quite possible he will not be a surgical candidate at this time -Currently NPO -Bili and AST improved today, Alk phos and ALT increased.   -GI following, but may need repeat ERCP depending on results of IOC and LFT trends -Labs in AM Elevated troponin's -Primary service needs to call cardiology back, need recommendations post echo results. -We may need to consider perc chole drain if not surgical candidate or delayed lap chole when heart is improved Elevated Alk phos - 171 Hyperbilirubinemia - 3.7 today Transaminitis - ALT up today, AST down    LOS: 4 days    Nat Christen 05/15/2015, 10:15 AM Pager: [phone]

## 2015-05-15 NOTE — Progress Notes (Signed)
Subjective: Bruce Alvarez was seen and examined this AM.  He slept well.  Tolerating clears.  Pain controlled. I informed him of ECHO results and need for further cardiology work-up which will likely delay OR.  Objective: Vital signs in last 24 hours: Filed Vitals:   05/14/15 1221 05/14/15 1317 05/14/15 2146 05/15/15 0541  BP: 192/138 148/92 147/93 167/115  Pulse: 94  87 85  Temp: 98.7 F (37.1 C)  98.3 F (36.8 C) 99 F (37.2 C)  TempSrc: Oral  Oral Oral  Resp: 18  19 18   Height:      Weight:      SpO2: 94%  93% 95%   Weight change:   Intake/Output Summary (Last 24 hours) at 05/15/15 0717 Last data filed at 05/15/15 0542  Gross per 24 hour  Intake 3494.17 ml  Output    550 ml  Net 2944.17 ml   General: sitting up on side of bed, NAD HEENT:  [place]/AT, thick neck Cardiac: RRR, no rubs, murmurs or gallops Pulm: clear to auscultation bilaterally, moving normal volumes of air Abd: soft, mild epigastric TTP, nondistended, BS present LE:  Trace B/L LE pitting edema Neuro: alert and oriented X3, responding appropriately, MAE spontaneously  Lab Results: Basic Metabolic Panel:  Recent Labs Lab 05/14/15 1230 05/15/15 0545  NA 136 134*  K 4.7 4.3  CL 103 100*  CO2 22 26  GLUCOSE 117* 114*  BUN 12 13  CREATININE 1.13 1.01  CALCIUM 7.9* 7.8*   Liver Function Tests:  Recent Labs Lab 05/14/15 0339 05/15/15 0545  AST 276* 246*  ALT 403* 457*  ALKPHOS 162* 171*  BILITOT 7.3* 3.7*  PROT 6.3* 6.3*  ALBUMIN 3.1* 3.1*    Recent Labs Lab 05/13/15 0035 05/14/15 0339  LIPASE 19* 18*   CBC:  Recent Labs Lab 05/11/15 1015  05/13/15 0035 05/14/15 0339 05/15/15 0545  WBC 7.0  < > 7.1 6.8 6.3  NEUTROABS 5.2  --  5.5  --   --   HGB 17.1*  < > 14.7 15.1 14.8  HCT 50.0  < > 44.3 46.0 44.3  MCV 98.8  < > 102.8* 102.7* 102.1*  PLT 197  < > 175 179 197  < > = values in this interval not displayed.  Medications: I have reviewed the patient's current  medications. Scheduled Meds: . antiseptic oral rinse  7 mL Mouth Rinse q12n4p  . carvedilol  12.5 mg Oral BID WC  . chlorhexidine  15 mL Mouth Rinse BID  . Chlorhexidine Gluconate Cloth  6 each Topical Daily  . fentaNYL (SUBLIMAZE) injection  50 mcg Intravenous Once  . heparin  5,000 Units Subcutaneous 3 times per day  . mupirocin ointment  1 application Nasal BID   Continuous Infusions: none . sodium chloride 10 mL/hr at 05/14/15 1654   PRN Meds:.0.9 % irrigation (POUR BTL), acetaminophen, HYDROmorphone (DILAUDID) injection, Marcaine 0.25% w/EPINEPHrine 1:200,000 20 mL with lidocaine 1% 20 mL injection, ondansetron **OR** ondansetron (ZOFRAN) IV, sodium chloride irrigation, zolpidem   Assessment/Plan: 44 year old male with PMH of HTN, low testosterone presents with abdominal pain and found to have CBD stone.  Choledocholithiasis:  Abdominal pain controlled.  Tolerating clears.  tbili better today, other enzymes slightly increased, except AST slightly decreased.  Appreciate GI and surgical interventions and recommendations.  Patient agreeable to lap chole.  He was informed of ECHO results and need for further cardiology input prior to OR.  He understands that OR may be delayed to  another day depending on further cards work-up. - plan is for lap chole once cardiac work-up complete  Elevated troponin:  Peaked at 0.19.  No CP.  Clinical picture not c/w ACS.  2D ECHO reveals systolic dysfunction.  Appreciate cardiology recommendations and interventions. - I spoke with cardiology today and they will see him today to make further recommendations.  AKI:  Resolved.  Patient reported decrease po prior to admission so this was likely pre-renal.  He tolerated liquids but currently NPO in anticipation of surgery. - NS d/c due to low EF  - hold losartan-HCTZ; continue to hold since pt may require cath - monitor renal function  Pseudoyperkalemia:  Likely lab error, K 4.7 on repeat and 4.3 this AM.     Hypertension:  Stable. Holding home losartan-HCTZ in the setting of AKI.   - Coreg 12.5mg  BID started this admission for BP control - prn hydral for SBP > 190/110 - pain control as above  Likely OSA:  Patient's hgb 17.1 at admission.  He has a large neck circumference and wife says he snores and often has apneic periods while sleeping but has not had formal OSA dx nor does he have a CPAP.  He is intermittently falling asleep and snoring this AM.   - advise sleep study at discharge  Hyperglycemia/probable diabetes:  He thought he had an elevated A1c in the past but not sure of number and cannot find in his MyChart (follows with [facility] PCP).  I informed him of current A1c 6.8 and likely diabetic status (it would be helpful to have a confirmatory A1c).  Serum glucose 114 this AM.  - follow-up with PCP after d/c - will need to make lifestyle changes and consider starting metformin  Dispo: Disposition is deferred at this time, awaiting improvement of current medical problems.  Anticipated discharge in approximately 2-3 day(s).   The patient does have a current PCP Dorothey Baseman, MD) and does not need an [facility] hospital follow-up appointment after discharge.  The patient does not know have transportation limitations that hinder transportation to clinic appointments.  .Services Needed at time of discharge: Y = Yes, Blank = No PT:   OT:   RN:   Equipment:   Other:     LOS: 4 days   Yolanda Manges, DO 05/15/2015, 7:17 AM

## 2015-05-15 NOTE — Progress Notes (Addendum)
Patient Name: Bruce Alvarez Date of Encounter: 05/15/2015  Principal Problem:   Choledocholithiasis Active Problems:   Hypertension   Low testosterone   Acute kidney injury   Systolic dysfunction   Length of Stay: 4  SUBJECTIVE  Echo shows surprisingly severe LV dysfunction with a global pattern. His wife reports longstanding problems with dependent pitting edema and dyspnea, although the patient minimizes these. Both his father and paternal grandfather had heart disease at early ages. Grandfather died in early 85s. Father had "heart attack" in his 5s, cardiomyopathy, ICD in his 55s, died of CHF. Had one stent.  Afebrile. Mild RUQ pain. Patient's bilirubin improving, transaminases still in 200-400s. Renal function is now normal.  CURRENT MEDS . antiseptic oral rinse  7 mL Mouth Rinse q12n4p  . carvedilol  12.5 mg Oral BID WC  . chlorhexidine  15 mL Mouth Rinse BID  . Chlorhexidine Gluconate Cloth  6 each Topical Daily  . fentaNYL (SUBLIMAZE) injection  50 mcg Intravenous Once  . heparin  5,000 Units Subcutaneous 3 times per day  . mupirocin ointment  1 application Nasal BID    OBJECTIVE   Intake/Output Summary (Last 24 hours) at 05/15/15 1120 Last data filed at 05/15/15 0927  Gross per 24 hour  Intake 3734.17 ml  Output    550 ml  Net 3184.17 ml   Filed Weights   05/11/15 2036  Weight: 270 lb (122.471 kg)    PHYSICAL EXAM Filed Vitals:   05/14/15 1221 05/14/15 1317 05/14/15 2146 05/15/15 0541  BP: 192/138 148/92 147/93 167/115  Pulse: 94  87 85  Temp: 98.7 F (37.1 C)  98.3 F (36.8 C) 99 F (37.2 C)  TempSrc: Oral  Oral Oral  Resp: Height:      Weight:      SpO2: 94%  93% 95%   General: Alert, oriented x3, no distress Head: no evidence of trauma, PERRL, EOMI, no exophtalmos or lid lag, no myxedema, no xanthelasma; normal ears, nose and oropharynx Neck: normal jugular venous pulsations and no hepatojugular reflux; brisk carotid pulses without  delay and no carotid bruits Chest: clear to auscultation, no signs of consolidation by percussion or palpation, normal fremitus, symmetrical and full respiratory excursions Cardiovascular: normal position and quality of the apical impulse, regular tachycardic rhythm, normal first and second heart sounds, no rubs, + summation gallop, no murmur Abdomen: no tenderness or distention, no masses by palpation, no abnormal pulsatility or arterial bruits, normal bowel sounds, no hepatosplenomegaly Extremities: no clubbing, cyanosis or edema; 2+ radial, ulnar and brachial pulses bilaterally; 2+ right femoral, posterior tibial and dorsalis pedis pulses; 2+ left femoral, posterior tibial and dorsalis pedis pulses; no subclavian or femoral bruits Neurological: grossly nonfocal  LABS  CBC  Recent Labs  05/13/15 0035 05/14/15 0339 05/15/15 0545  WBC 7.1 6.8 6.3  NEUTROABS 5.5  --   --   HGB 14.7 15.1 14.8  HCT 44.3 46.0 44.3  MCV 102.8* 102.7* 102.1*  PLT 175 179 197   Basic Metabolic Panel  Recent Labs  05/14/15 1230 05/15/15 0545  NA 136 134*  K 4.7 4.3  CL 103 100*  CO2 22 26  GLUCOSE 117* 114*  BUN 12 13  CREATININE 1.13 1.01  CALCIUM 7.9* 7.8*   Liver Function Tests  Recent Labs  05/14/15 0339 05/15/15 0545  AST 276* 246*  ALT 403* 457*  ALKPHOS 162* 171*  BILITOT 7.3* 3.7*  PROT 6.3* 6.3*  ALBUMIN 3.1* 3.1*  Recent Labs  05/13/15 0035 05/14/15 0339  LIPASE 19* 18*   Cardiac Enzymes  Recent Labs  05/12/15 1630 05/12/15 2105 05/13/15 0035  TROPONINI 0.19* 0.18* 0.14*   BNP Invalid input(s): POCBNP D-Dimer No results for input(s): DDIMER in the last 72 hours. Hemoglobin A1C  Recent Labs  05/13/15 0035  HGBA1C 6.8*   Fasting Lipid Panel  Recent Labs  05/13/15 0035  CHOL 195  HDL 50  LDLCALC 120*  TRIG 124  CHOLHDL 3.9   Radiology Studies Imaging results have been reviewed and No results found.  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 Differential includes: - painless multivessel CAD - familial dilated cardiomyopathy. Consider hemochromatosis (LVH, DM, hypoandrogenism, although liver abnormalities may have other etiology) - stress (Takotsubo) cardiomyopathy Will need cardiac cath to distinguish these. Discussed this with patient and wife. If multivessel CAD is identified, he will need CABG and will have to decide how to manage the gallbladder in the interim. If single vessel CAD is identified (unllikely), PCI/stent should be deferred until after gallbladder surgery, unless the anatomy is truly high-risk/life threatening. That way we can avoid issues related to antiplatelet agents. This procedure has been fully reviewed with the patient and written informed consent has been obtained.  CHF, systolic, unknown chronicity He is already on carvedilol. Will add ACEi after coronary angio. He appears euvolemic on exam, other than the tachycardia, which could be multifactorial.  Obstructive sleep apnea Clearly present by history and exam. Will need formal sleep study and treatment after discharge.  Hyperlipidemia Even if no CAD is identified, this will require treatment since he has DM. Target <70 if vascular disease is found, <100 if not.  Diabetes mellitus HgbA1c 6.8% without meds  Acute cholecystitis/choledocolithiasis Delay surgery until after cardiac cath    Thurmon Fair, MD, [facility] [phone] office [phone] pager 05/15/2015 11:20 AM

## 2015-05-16 ENCOUNTER — Ambulatory Visit (HOSPITAL_COMMUNITY): Payer: Medicaid Other

## 2015-05-16 ENCOUNTER — Encounter (HOSPITAL_COMMUNITY): Payer: Self-pay | Admitting: Cardiovascular Disease

## 2015-05-16 ENCOUNTER — Encounter (HOSPITAL_COMMUNITY)
Admission: EM | Disposition: A | Discharge status: Home / Self Care | Payer: Self-pay | Source: Home / Self Care | Attending: Emergency Medicine

## 2015-05-16 DIAGNOSIS — I429 Cardiomyopathy, unspecified: Secondary | ICD-10-CM

## 2015-05-16 DIAGNOSIS — I428 Other cardiomyopathies: Secondary | ICD-10-CM

## 2015-05-16 DIAGNOSIS — E291 Testicular hypofunction: Secondary | ICD-10-CM

## 2015-05-16 DIAGNOSIS — E119 Type 2 diabetes mellitus without complications: Secondary | ICD-10-CM | POA: Diagnosis present

## 2015-05-16 DIAGNOSIS — K8020 Calculus of gallbladder without cholecystitis without obstruction: Secondary | ICD-10-CM | POA: Diagnosis present

## 2015-05-16 HISTORY — PX: CARDIAC CATHETERIZATION: SHX172

## 2015-05-16 LAB — COMPREHENSIVE METABOLIC PANEL
ALK PHOS: 180 U/L — AB (ref 38–126)
ALT: 357 U/L — ABNORMAL HIGH (ref 17–63)
ANION GAP: 8 (ref 5–15)
AST: 134 U/L — ABNORMAL HIGH (ref 15–41)
Albumin: 3.1 g/dL — ABNORMAL LOW (ref 3.5–5.0)
BUN: 12 mg/dL (ref 6–20)
CHLORIDE: 100 mmol/L — AB (ref 101–111)
CO2: 27 mmol/L (ref 22–32)
Calcium: 8.3 mg/dL — ABNORMAL LOW (ref 8.9–10.3)
Creatinine, Ser: 0.99 mg/dL (ref 0.61–1.24)
GFR calc Af Amer: >60 mL/min (ref >60)
GFR calc non Af Amer: >60 mL/min (ref >60)
GLUCOSE: 119 mg/dL — AB (ref 65–99)
Potassium: 4.4 mmol/L (ref 3.5–5.1)
SODIUM: 135 mmol/L (ref 135–145)
Total Bilirubin: 2.4 mg/dL — ABNORMAL HIGH (ref 0.3–1.2)
Total Protein: 6.6 g/dL (ref 6.5–8.1)

## 2015-05-16 LAB — TSH: TSH: 1.161 u[IU]/mL (ref 0.350–4.500)

## 2015-05-16 LAB — PROTIME-INR
INR: 1 (ref 0.00–1.49)
Prothrombin Time: 13.4 seconds (ref 11.6–15.2)

## 2015-05-16 LAB — POCT I-STAT 3, ART BLOOD GAS (G3+)
ACID-BASE DEFICIT: 2 mmol/L (ref 0.0–2.0)
Bicarbonate: 23.9 mEq/L (ref 20.0–24.0)
O2 Saturation: 98 %
PH ART: 7.334 — AB (ref 7.350–7.450)
PO2 ART: 112 mmHg — AB (ref 80.0–100.0)
TCO2: 25 mmol/L (ref 0–100)
pCO2 arterial: 44.9 mmHg (ref 35.0–45.0)

## 2015-05-16 LAB — POCT I-STAT 3, VENOUS BLOOD GAS (G3P V)
BICARBONATE: 26.7 meq/L — AB (ref 20.0–24.0)
O2 SAT: 67 %
TCO2: 28 mmol/L (ref 0–100)
pCO2, Ven: 50.6 mmHg — ABNORMAL HIGH (ref 45.0–50.0)
pH, Ven: 7.33 — ABNORMAL HIGH (ref 7.250–7.300)
pO2, Ven: 38 mmHg (ref 30.0–45.0)

## 2015-05-16 LAB — FERRITIN: FERRITIN: 177 ng/mL (ref 24–336)

## 2015-05-16 LAB — IRON AND TIBC
Iron: 62 ug/dL (ref 45–182)
SATURATION RATIOS: 18 % (ref 17.9–39.5)
TIBC: 336 ug/dL (ref 250–450)
UIBC: 274 ug/dL

## 2015-05-16 LAB — RAPID HIV SCREEN (HIV 1/2 AB+AG)
HIV 1/2 ANTIBODIES: NONREACTIVE
HIV-1 P24 ANTIGEN - HIV24: NONREACTIVE

## 2015-05-16 SURGERY — RIGHT/LEFT HEART CATH AND CORONARY ANGIOGRAPHY

## 2015-05-16 MED ORDER — LIDOCAINE HCL (PF) 1 % IJ SOLN
INTRAMUSCULAR | Status: AC
  Filled 2015-05-16: qty 30

## 2015-05-16 MED ORDER — MIDAZOLAM HCL 2 MG/2ML IJ SOLN
INTRAMUSCULAR | Status: DC | PRN
  Administered 2015-05-16: 2 mg via INTRAVENOUS

## 2015-05-16 MED ORDER — FENTANYL CITRATE (PF) 100 MCG/2ML IJ SOLN
INTRAMUSCULAR | Status: AC
  Filled 2015-05-16: qty 4

## 2015-05-16 MED ORDER — SODIUM CHLORIDE 0.9 % IJ SOLN: 3.0000 mL | INTRAMUSCULAR | Status: DC | PRN

## 2015-05-16 MED ORDER — SODIUM CHLORIDE 0.9 % IJ SOLN
3.0000 mL | Freq: Two times a day (BID) | INTRAMUSCULAR | Status: DC
  Administered 2015-05-16: 3 mL via INTRAVENOUS

## 2015-05-16 MED ORDER — HEPARIN (PORCINE) IN NACL 2-0.9 UNIT/ML-% IJ SOLN
INTRAMUSCULAR | Status: DC | PRN
  Administered 2015-05-16: 10:00:00

## 2015-05-16 MED ORDER — HYDRALAZINE HCL 20 MG/ML IJ SOLN
INTRAMUSCULAR | Status: AC
  Filled 2015-05-16: qty 1

## 2015-05-16 MED ORDER — SODIUM CHLORIDE 0.9 % IV SOLN: 250.0000 mL | INTRAVENOUS | Status: DC | PRN

## 2015-05-16 MED ORDER — IOHEXOL 350 MG/ML SOLN
INTRAVENOUS | Status: DC | PRN
  Administered 2015-05-16: 65 mL via INTRAVENOUS

## 2015-05-16 MED ORDER — FUROSEMIDE 40 MG PO TABS
40.0000 mg | ORAL_TABLET | Freq: Two times a day (BID) | ORAL | Status: DC
  Administered 2015-05-16 – 2015-05-17 (×2): 40 mg via ORAL
  Filled 2015-05-16 (×2): qty 1

## 2015-05-16 MED ORDER — FENTANYL CITRATE (PF) 100 MCG/2ML IJ SOLN
INTRAMUSCULAR | Status: DC | PRN
  Administered 2015-05-16: 25 ug via INTRAVENOUS

## 2015-05-16 MED ORDER — SODIUM CHLORIDE 0.9 % IV SOLN: INTRAVENOUS | Status: DC

## 2015-05-16 MED ORDER — HYDRALAZINE HCL 20 MG/ML IJ SOLN
INTRAMUSCULAR | Status: DC | PRN
  Administered 2015-05-16: 20 mg via INTRAVENOUS

## 2015-05-16 MED ORDER — LORAZEPAM 2 MG/ML IJ SOLN
0.5000 mg | Freq: Once | INTRAMUSCULAR | Status: AC
  Administered 2015-05-16: 19:00:00 0.5 mg via INTRAVENOUS
  Filled 2015-05-16: qty 1

## 2015-05-16 MED ORDER — LOSARTAN POTASSIUM 50 MG PO TABS
100.0000 mg | ORAL_TABLET | Freq: Every day | ORAL | Status: DC
  Administered 2015-05-16 – 2015-05-17 (×2): 100 mg via ORAL
  Filled 2015-05-16 (×2): qty 2

## 2015-05-16 MED ORDER — HYDRALAZINE HCL 20 MG/ML IJ SOLN
20.0000 mg | INTRAMUSCULAR | Status: DC | PRN
  Administered 2015-05-16 – 2015-05-17 (×3): 20 mg via INTRAVENOUS
  Filled 2015-05-16 (×3): qty 1

## 2015-05-16 MED ORDER — HEPARIN (PORCINE) IN NACL 2-0.9 UNIT/ML-% IJ SOLN
INTRAMUSCULAR | Status: AC
  Filled 2015-05-16: qty 1000

## 2015-05-16 MED ORDER — NITROGLYCERIN 1 MG/10 ML FOR IR/CATH LAB
INTRA_ARTERIAL | Status: AC
  Filled 2015-05-16: qty 10

## 2015-05-16 MED ORDER — MIDAZOLAM HCL 2 MG/2ML IJ SOLN
INTRAMUSCULAR | Status: AC
Start: 2015-05-16 — End: 2015-05-16
  Filled 2015-05-16: qty 4

## 2015-05-16 SURGICAL SUPPLY — 8 items
CATH INFINITI 5FR MULTPACK ANG (CATHETERS) ×3 IMPLANT
CATH SWAN GANZ 7F STRAIGHT (CATHETERS) ×3 IMPLANT
KIT HEART LEFT (KITS) ×3 IMPLANT
PACK CARDIAC CATHETERIZATION (CUSTOM PROCEDURE TRAY) ×3 IMPLANT
SHEATH PINNACLE 5F 10CM (SHEATH) ×3 IMPLANT
SHEATH PINNACLE 7F 10CM (SHEATH) ×3 IMPLANT
TRANSDUCER W/STOPCOCK (MISCELLANEOUS) ×3 IMPLANT
WIRE EMERALD 3MM-J .035X150CM (WIRE) ×3 IMPLANT

## 2015-05-16 NOTE — Progress Notes (Signed)
Bruce Alvarez was seen on rounds this morning with the housestaff.  This is a delayed entry from that evaluation.  S: Bruce Alvarez is w/o acute complaints this AM.  O:   Filed Vitals:   05/16/15 1245 05/16/15 1310 05/16/15 1320 05/16/15 1650  BP: 186/120 213/124 187/99 222/125  Pulse: 81 80 82 88  Temp:    98 F (36.7 C)  TempSrc:    Oral  Resp: Height:      Weight:      SpO2: 96% 97% 96% 96%   Physical Exam:  Gen: WDWN obese man lying comfortably flat in bed in NAD Lungs: CTA B w/o wheezes, rhonchi, or rales CV: RRR w/o murmurs, rubs, or gallops Abd: Soft, non-tender Ext: w/o edema  Lab results:  Basic Metabolic Panel:  Recent Labs  16/10/96 0545 05/16/15 0403  NA 134* 135  K 4.3 4.4  CL 100* 100*  CO2 26 27  GLUCOSE 114* 119*  BUN 13 12  CREATININE 1.01 0.99  CALCIUM 7.8* 8.3*   Liver Function Tests:  Recent Labs  05/15/15 0545 05/16/15 0403  AST 246* 134*  ALT 457* 357*  ALKPHOS 171* 180*  BILITOT 3.7* 2.4*  PROT 6.3* 6.6  ALBUMIN 3.1* 3.1*    Recent Labs  05/14/15 0339  LIPASE 18*   CBC:  Recent Labs  05/14/15 0339 05/15/15 0545  WBC 6.8 6.3  HGB 15.1 14.8  HCT 46.0 44.3  MCV 102.7* 102.1*  PLT 179 197   Thyroid Function Tests:  Recent Labs  05/16/15 1614  TSH 1.161   Anemia Panel:  Recent Labs  05/16/15 1614  FERRITIN 177  TIBC 336  IRON 62   Coagulation:  Recent Labs  05/16/15 0403  INR 1.00   Other results:  Left and Right Heart Cath:  Please see report under cardiovascular procedures section.  Pertinent results include the following:  Coronary arteries w/o occlusion PA Mean 48 mmHg PW Mean 23 mmHg LV EDP 30 mmHg  Assessment & Plan by Problem:  Bruce Alvarez is a 44 year old man who presented with symptomatic choledocholithiasis and was found to have a troponin leak that eventually led to the discovery of a cardiomyopathy which was nonischemic by cardiac cath. 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  He is also noted to have pulmonary artery hypertension that I believe is not simply secondary to left heart failure. I believe his pulmonary hypertension is also related to his morbid obesity with obstructive sleep apnea and nocturnal hypoxemia. Regardless, he will require formal assessment with a nocturnal polysomnography examination upon discharge and will require aggressive management of his left heart failure as well. The testosterone is unlikely to provide him with any meaningful improvement in quality of life and is probably not only worsening his obstructive sleep apnea but contributing to his uncontrolled hypertension. Therefore the benefits versus the risks at least acutely should be assessed with this in mind. Surgery does not feel comfortable with an elective laproscopic cholecystectomy until his cardiac status is better compensated. As he is asymptomatic and his transaminases are decreasing this is quite reasonable.   1) Nonischemic cardiomyopathy: he was previously started on carvedilol and this will be titrated up as an outpatient to pulse. He was also restarted on losartan after the cardiac catheterization not only for his nonischemic cardiomyopathy but also to help for his afterload, especially in the setting of newly diagnosed diabetes. He was given IV diuresis today and will be discharged home on  oral diuretics whicht can be titrated as an outpatient. After he has been maximized on his beta blocker and ACE inhibitor his heart function can be reassessed in 6 months to see if he would be a candidate for an AICD.   2) Pulmonary hypertension: likely secondary to both chronic nocturnal hypoxemia secondary to untreated obstructive sleep apnea as well as decompensated left heart failure. The left heart failure is being addressed as above. His obstructive sleep apnea will need to be assessed as an outpatient with a formal polysomnogram. If he is diagnosed with obstructive sleep apnea a CPAP titration  trial can be undertaken to optimize the therapy for his obstructive sleep apnea. The treatment of his obstructive sleep apnea will likely result in not only improvement in his hypertension and pulmonary hypertension but also improvement in his diabetes.   3) Diabetes: prior to discharge we will ask that he receive diabetic education for diet and lifestyle. It will be imperative for him to implement some of these changes over the next several months in order to assist with other therapy to address his multiple medical problems. With a hemoglobin A1c of 6.8 he is not yet at the point that requires pharmacologic therapy.   4) Hypogonadism: We will stop the testosterone and ask that it not be restarted until he has better control of his hypertension, nonischemic cardiomyopathy and fully assessed, and if necessary, treated obstructive sleep apnea. At this point the benefits clearly do not outweigh the risks of testosterone therapy.   5) Disposition: I anticipate he will be ready for discharge home tomorrow with follow-up in cardiology for his heart failure, sleep medicine for his obstructive sleep apnea, and primary care for management of his other chronic medical conditions.

## 2015-05-16 NOTE — Progress Notes (Signed)
No evidence of coronary artery disease. His BP in the cath lab is 200/100. I suspect that he has a hypertensive cardiomyopathy. Will restart Losartan. Titrate Coreg. He needs better control of his BP. Will also begin Lasix for diuresis. Cardiology team will follow. He will need follow up with Dr. [place] post discharge in our office.   Jacquie Lukes 05/16/2015 9:38 AM

## 2015-05-16 NOTE — Interval H&P Note (Signed)
History and Physical Interval Note:  05/16/2015 8:40 AM  Bruce Alvarez  has presented today for cardiac cath with the diagnosis of cardiomyopathy, elevated troponin. The various methods of treatment have been discussed with the patient and family. After consideration of risks, benefits and other options for treatment, the patient has consented to  Procedure(s): Right/Left Heart Cath and Coronary Angiography (N/A) as a surgical intervention .  The patient's history has been reviewed, patient examined, no change in status, stable for surgery.  I have reviewed the patient's chart and labs.  Questions were answered to the patient's satisfaction.    Cath Lab Visit (complete for each Cath Lab visit)  Clinical Evaluation Leading to the Procedure:   ACS: Yes.    Non-ACS:    Anginal Classification: CCS II  Anti-ischemic medical therapy: No Therapy  Non-Invasive Test Results: No non-invasive testing performed  Prior CABG: No previous CABG         [name]

## 2015-05-16 NOTE — Progress Notes (Signed)
Site area: rt groin fa and fv sheaths pulled and pressure held by [employer] Prior to Removal:  Level  0 Pressure Applied For:  20 minutes Manual:   yes Patient Status During Pull:  stable Post Pull Site:  Level  0 Post Pull Instructions Given:  yes Post Pull Pulses Present: yes Dressing Applied:  dressing Bedrest begins @ 0950 Comments:  0

## 2015-05-16 NOTE — H&P (View-Only) (Signed)
Patient Name: Bruce Alvarez Date of Encounter: 05/15/2015  Principal Problem:   Choledocholithiasis Active Problems:   Hypertension   Low testosterone   Acute kidney injury   Systolic dysfunction   Length of Stay: 4  SUBJECTIVE  Echo shows surprisingly severe LV dysfunction with a global pattern. His wife reports longstanding problems with dependent pitting edema and dyspnea, although the patient minimizes these. Both his father and paternal grandfather had heart disease at early ages. Grandfather died in early 40s. Father had "heart attack" in his 40s, cardiomyopathy, ICD in his 60s, died of CHF. Had one stent.  Afebrile. Mild RUQ pain. Patient's bilirubin improving, transaminases still in 200-400s. Renal function is now normal.  CURRENT MEDS . antiseptic oral rinse  7 mL Mouth Rinse q12n4p  . carvedilol  12.5 mg Oral BID WC  . chlorhexidine  15 mL Mouth Rinse BID  . Chlorhexidine Gluconate Cloth  6 each Topical Daily  . fentaNYL (SUBLIMAZE) injection  50 mcg Intravenous Once  . heparin  5,000 Units Subcutaneous 3 times per day  . mupirocin ointment  1 application Nasal BID    OBJECTIVE   Intake/Output Summary (Last 24 hours) at 05/15/15 1120 Last data filed at 05/15/15 0927  Gross per 24 hour  Intake 3734.17 ml  Output    550 ml  Net 3184.17 ml   Filed Weights   05/11/15 2036  Weight: 270 lb (122.471 kg)    PHYSICAL EXAM Filed Vitals:   05/14/15 1221 05/14/15 1317 05/14/15 2146 05/15/15 0541  BP: 192/138 148/92 147/93 167/115  Pulse: 94  87 85  Temp: 98.7 F (37.1 C)  98.3 F (36.8 C) 99 F (37.2 C)  TempSrc: Oral  Oral Oral  Resp: 18  19 18  Height:      Weight:      SpO2: 94%  93% 95%   General: Alert, oriented x3, no distress Head: no evidence of trauma, PERRL, EOMI, no exophtalmos or lid lag, no myxedema, no xanthelasma; normal ears, nose and oropharynx Neck: normal jugular venous pulsations and no hepatojugular reflux; brisk carotid pulses without  delay and no carotid bruits Chest: clear to auscultation, no signs of consolidation by percussion or palpation, normal fremitus, symmetrical and full respiratory excursions Cardiovascular: normal position and quality of the apical impulse, regular tachycardic rhythm, normal first and second heart sounds, no rubs, + summation gallop, no murmur Abdomen: no tenderness or distention, no masses by palpation, no abnormal pulsatility or arterial bruits, normal bowel sounds, no hepatosplenomegaly Extremities: no clubbing, cyanosis or edema; 2+ radial, ulnar and brachial pulses bilaterally; 2+ right femoral, posterior tibial and dorsalis pedis pulses; 2+ left femoral, posterior tibial and dorsalis pedis pulses; no subclavian or femoral bruits Neurological: grossly nonfocal  LABS  CBC  Recent Labs  05/13/15 0035 05/14/15 0339 05/15/15 0545  WBC 7.1 6.8 6.3  NEUTROABS 5.5  --   --   HGB 14.7 15.1 14.8  HCT 44.3 46.0 44.3  MCV 102.8* 102.7* 102.1*  PLT 175 179 197   Basic Metabolic Panel  Recent Labs  05/14/15 1230 05/15/15 0545  NA 136 134*  K 4.7 4.3  CL 103 100*  CO2 22 26  GLUCOSE 117* 114*  BUN 12 13  CREATININE 1.13 1.01  CALCIUM 7.9* 7.8*   Liver Function Tests  Recent Labs  05/14/15 0339 05/15/15 0545  AST 276* 246*  ALT 403* 457*  ALKPHOS 162* 171*  BILITOT 7.3* 3.7*  PROT 6.3* 6.3*  ALBUMIN 3.1* 3.1*      Recent Labs  05/13/15 0035 05/14/15 0339  LIPASE 19* 18*   Cardiac Enzymes  Recent Labs  05/12/15 1630 05/12/15 2105 05/13/15 0035  TROPONINI 0.19* 0.18* 0.14*   BNP Invalid input(s): POCBNP D-Dimer No results for input(s): DDIMER in the last 72 hours. Hemoglobin A1C  Recent Labs  05/13/15 0035  HGBA1C 6.8*   Fasting Lipid Panel  Recent Labs  05/13/15 0035  CHOL 195  HDL 50  LDLCALC 120*  TRIG 124  CHOLHDL 3.9   Radiology Studies Imaging results have been reviewed and No results found.  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 Differential includes: - painless multivessel CAD - familial dilated cardiomyopathy. Consider hemochromatosis (LVH, DM, hypoandrogenism, although liver abnormalities may have other etiology) - stress (Takotsubo) cardiomyopathy Will need cardiac cath to distinguish these. Discussed this with patient and wife. If multivessel CAD is identified, he will need CABG and will have to decide how to manage the gallbladder in the interim. If single vessel CAD is identified (unllikely), PCI/stent should be deferred until after gallbladder surgery, unless the anatomy is truly high-risk/life threatening. That way we can avoid issues related to antiplatelet agents. This procedure has been fully reviewed with the patient and written informed consent has been obtained.  CHF, systolic, unknown chronicity He is already on carvedilol. Will add ACEi after coronary angio. He appears euvolemic on exam, other than the tachycardia, which could be multifactorial.  Obstructive sleep apnea Clearly present by history and exam. Will need formal sleep study and treatment after discharge.  Hyperlipidemia Even if no CAD is identified, this will require treatment since he has DM. Target <70 if vascular disease is found, <100 if not.  Diabetes mellitus HgbA1c 6.8% without meds  Acute cholecystitis/choledocolithiasis Delay surgery until after cardiac cath    Thurmon Fair, MD, [facility] [phone] office [phone] pager 05/15/2015 11:20 AM

## 2015-05-16 NOTE — Progress Notes (Signed)
[facility] Progress Note  Day of Surgery  Subjective: Pt has no pain or N/V.  Just got back from cath.  Sitting down to eat lunch.  Ambulating well.  Having flatus, last BM was 05/10/15.  Urinating well.    Objective: Vital signs in last 24 hours: Temp:  [97.6 F (36.4 C)-98.7 F (37.1 C)] 98.7 F (37.1 C) (08/08 0815) Pulse Rate:  [82-91] 83 (08/08 0815) Resp:  [18-25] 18 (08/08 0815) BP: (120-195)/(68-127) 195/127 mmHg (08/08 0815) SpO2:  [95 %-98 %] 98 % (08/08 0815) Weight:  [132.949 kg (293 lb 1.6 oz)] 132.949 kg (293 lb 1.6 oz) (08/07 1838) Last BM Date: 05/10/15  Intake/Output from previous day: 08/07 0701 - 08/08 0700 In: 840 [P.O.:840] Out: 200 [Urine:200] Intake/Output this shift:    PE: Gen:  Alert, NAD, pleasant Abd: obese, soft, NT/ND, +BS   Lab Results:   Recent Labs  05/14/15 0339 05/15/15 0545  WBC 6.8 6.3  HGB 15.1 14.8  HCT 46.0 44.3  PLT 179 197   BMET  Recent Labs  05/15/15 0545 05/16/15 0403  NA 134* 135  K 4.3 4.4  CL 100* 100*  CO2 26 27  GLUCOSE 114* 119*  BUN 13 12  CREATININE 1.01 0.99  CALCIUM 7.8* 8.3*   PT/INR  Recent Labs  05/16/15 0403  LABPROT 13.4  INR 1.00   CMP     Component Value Date/Time   NA 135 05/16/2015 0403   K 4.4 05/16/2015 0403   CL 100* 05/16/2015 0403   CO2 27 05/16/2015 0403   GLUCOSE 119* 05/16/2015 0403   BUN 12 05/16/2015 0403   CREATININE 0.99 05/16/2015 0403   CALCIUM 8.3* 05/16/2015 0403   PROT 6.6 05/16/2015 0403   ALBUMIN 3.1* 05/16/2015 0403   AST 134* 05/16/2015 0403   ALT 357* 05/16/2015 0403   ALKPHOS 180* 05/16/2015 0403   BILITOT 2.4* 05/16/2015 0403   GFRNONAA >60 05/16/2015 0403   GFRAA >60 05/16/2015 0403   Lipase     Component Value Date/Time   LIPASE 18* 05/14/2015 0339       Studies/Results: No results found.  Anti-infectives: Anti-infectives    Start     Dose/Rate Route Frequency Ordered Stop   05/12/15 1200  ciprofloxacin (CIPRO) IVPB  400 mg  Status:  Discontinued     400 mg 200 mL/hr over 60 Minutes Intravenous  Once 05/12/15 1153 05/12/15 1455       Assessment/Plan Choledocholithiasis s/p ERCP with sphincterotomy -Eating HH diet -Bili and transaminases improved, alk phos slightly up -Pain resolved Elevated troponin's -Echo showed EF of 25%, Cath clean, they suspect hypertensive cardiomyopathy -Cardiology feels he is okay to proceed with lap chole since cath is clean, but I'll let Dr. Brantley Stage decide. -Will let Dr. Brantley Stage see and eval, but will likely need to follow up with Dr. Donne Hazel as an outpatient for delayed lap chole given the health of his heart Elevated Alk phos - 180 up today Hyperbilirubinemia - 2.4 today Transaminitis - ALT and AST down today     LOS: 5 days    Nat Christen 05/16/2015, 8:40 AM Pager: [phone]

## 2015-05-16 NOTE — Progress Notes (Signed)
Subjective:    Patient is feeling well and in his normal state of health. He was surprised to find out the results of his echo which demonstrated EF of 25-30%, and was thankful to have found out before having any adverse consequences. He is tolerating food well and able to ambulate without difficulty. He denies any SOB of CP. His abd pain has fully resolved.  Interval Events: Echo demonstrated CHF with LVEF 25-30%. Heart Cath demonstrated no CAD. TBili has begun to downtrend (Peak @ 7.3 --> 3.7 --> 2.4). Transaminases downtrending (AST 276 --> 246 --> 134; ALT 403 --> 457 --> 357). AlkPhos uptrending slightly (162 --> 171 --> 180). Renal function normalized, K WNL.   Objective:    Vital Signs:   Temp:  [97.6 F (36.4 C)-98.7 F (37.1 C)] 97.8 F (36.6 C) (08/08 1216) Pulse Rate:  [0-113] 82 (08/08 1216) Resp:  [0-25] 19 (08/08 1216) BP: (120-201)/(68-133) 199/115 mmHg (08/08 1216) SpO2:  [0 %-99 %] 95 % (08/08 1216) Weight:  [132.949 kg (293 lb 1.6 oz)] 132.949 kg (293 lb 1.6 oz) (08/07 1838) Last BM Date: 05/15/15  Intake/Output:   Intake/Output Summary (Last 24 hours) at 05/16/15 1311 Last data filed at 05/16/15 0200  Gross per 24 hour  Intake    600 ml  Output    200 ml  Net    400 ml      Physical Exam: General: Vital signs reviewed and noted. Well-developed, well-nourished, NAD; alert, appropriate and cooperative throughout examination.  Lungs:  Normal respiratory effort. Clear to auscultation BL without crackles or wheezes.  Heart: RRR. S1 and S2 normal without gallop, murmur, or rubs.  Abdomen:  BS normoactive. Obese, non-tender.  No masses or organomegaly.  Extremities: 1+ pretibial edema bilaterally. Venous stasis changes vs hemosiderin deposition in the LE bilaterally     Labs:  Basic Metabolic Panel:  Recent Labs Lab 05/13/15 0035 05/14/15 0339 05/14/15 1230 05/15/15 0545 05/16/15 0403  NA 133* 134* 136 134* 135  K 4.1 5.4* 4.7 4.3 4.4  CL 95*  101 103 100* 100*  CO2 GLUCOSE 171* 146* 117* 114* 119*  BUN CREATININE 1.39* 1.17 1.13 1.01 0.99  CALCIUM 8.0* 7.9* 7.9* 7.8* 8.3*    Liver Function Tests:  Recent Labs Lab 05/12/15 0351 05/13/15 0035 05/14/15 0339 05/15/15 0545 05/16/15 0403  AST 428* 289* 276* 246* 134*  ALT 400* 360* 403* 457* 357*  ALKPHOS 143* 146* 162* 171* 180*  BILITOT 4.8* 6.4* 7.3* 3.7* 2.4*  PROT 7.4 6.6 6.3* 6.3* 6.6  ALBUMIN 3.8 3.4* 3.1* 3.1* 3.1*    Recent Labs Lab 05/11/15 1015 05/12/15 0909 05/13/15 0035 05/14/15 0339  LIPASE 172* 81* 19* 18*   CBC:  Recent Labs Lab 05/11/15 1015 05/12/15 0351 05/13/15 0035 05/14/15 0339 05/15/15 0545  WBC 7.0 12.0* 7.1 6.8 6.3  NEUTROABS 5.2  --  5.5  --   --   HGB 17.1* 17.0 14.7 15.1 14.8  HCT 50.0 50.1 44.3 46.0 44.3  MCV 98.8 102.5* 102.8* 102.7* 102.1*  PLT 197 294 175 179 197    Cardiac Enzymes:  Recent Labs Lab 05/11/15 1015 05/11/15 2143 05/12/15 1630 05/12/15 2105 05/13/15 0035  TROPONINI 0.04* 0.06* 0.19* 0.18* 0.14*    Imaging: No results found.   Medications:    Infusions: . sodium chloride 10 mL/hr at 05/15/15 2008  . sodium chloride 50 mL/hr at 05/16/15 [phone]  Scheduled Medications: . antiseptic oral rinse  7 mL Mouth Rinse q12n4p  . carvedilol  12.5 mg Oral BID WC  . chlorhexidine  15 mL Mouth Rinse BID  . Chlorhexidine Gluconate Cloth  6 each Topical Daily  . fentaNYL (SUBLIMAZE) injection  50 mcg Intravenous Once  . furosemide  40 mg Oral BID  . heparin  5,000 Units Subcutaneous 3 times per day  . losartan  100 mg Oral Daily  . mupirocin ointment  1 application Nasal BID  . sodium chloride  3 mL Intravenous Q12H    PRN Medications: sodium chloride, acetaminophen, hydrALAZINE, HYDROmorphone (DILAUDID) injection, ondansetron **OR** ondansetron (ZOFRAN) IV, sodium chloride, zolpidem   Assessment/ Plan:    Pt is a 44 y.o. yo male with a PMHx of HTN and Low T who  was admitted on 05/11/2015 with symptoms of acute epigastric pain, which was determined to be secondary to choledocolithiasis. He was subsequently found to have a troponin leak with echo demonstrating reduced LVEF at 25-30%. Interventions at this time will be focused on evaluation and management of his CHF, with a plan for cholecystectomy when medically stable.   1) Choledocolithiasis: Stable s/p ERCP. No pain. AlkPhos continues to trend upward. Transaminases downtrending, TBili 2.4 (peak at 7.3). Non-jaundiced, non-icteric. Afeb w/o leukocytosis. Concern for another obstruction given rising AlkPhos, will consider second ERCP after Lap Chole with IOC. -- GI consulted, s/p ERCP, appreciate recs:  -- Monitor for post-ERCP pancreatitis  -- Concern for repeat obstruction given increasing AlkPhos   - Consider repeat ERCP following intra-op cholangiogram during Lap Chole  -- continue to trend CMet -- Surg consulted, awaiting recs:  -- Lap chole tomorrow vs. delayed lap chole in the setting of low EF cardiomyopathy (outpt f/u w/ Dr. Dwain Sarna)  -- f/u Dr. Rosezena Sensor note from 8/8 for final recommendation -- Pain managed with IV Dilaudid (1mg  q2h PRN pain) -- IV Zofran (4mg  q6h PRN nausea) -- NPO for cholecystectomy tomorrow  2) Cardiomyopathy w/ CHF: Recently diagnosed on echo demonstrating LVEF of 25-30%. Unlikely ischemic given heart cath showing no CAD. Consider familial dilated cardiomyopathy vs hypertensive cardiomyopathy vs alcoholic vs stress induced vs OSA. Possibly hemochromatosis given LVH, DM, hypogonadism, dermatologic hemosiderin deposition, and elevated LFTs (although confounded by acute choledocolithiasis). BP uncontrolled on admission in the 220s/110s. Appears euvolemic on exam: Lungs CTAB, 1+ pedal edema, no SOB/orthopnea/PND. -- Cards consulted, appreciate recs:  -- Echo with LVEF 25-30%, grade 1 dCHF, LVH, hypokinesis, dilated LA, RV, RA  -- Cath showing no CAD, + for pulm HTN, + for  elevated LV EDP  -- Continue carvedilol 12.5  -- Restart losartan 100mg  (hold HCTZ 25mg )  -- Trial of diuresis with 40mg  IV Lasix BID and reassess fluid status  -- monitor I&Os, daily wts  -- d/c IVF, 2g sodium restricted diet  -- Cleared for surgery from a cardiac standpoint -- Consider broad w/u for dilated/restrictive cardiomyopathy  -- Fe Panel for hemochromatosis  -- TSH (hypothyroid), ANA (SLE), SPEP (amyloidosis), ACE + CXR (sarcoidosis), HIV  3) Hypertension: Resolved. -- Restart home losartan/HCTZ -- Continue Carvedilol -- Hydralizine 10mg  IV PRN systolics >179mmHg / diastolic >123mmHg  4) Alcohol abuse: Currently stable with VSS. Drinks 14 shots of liquor weekly. -- CIWA protocol  5) AKI: Resolved (Cr peak 1.39, likely 2/2 to dehydration). Baseline renal function Cr. 0.80.  6) Elevated Hgb: Hgb down-trended to 14.7 from 17. Elevation likely due to OSA which can worsen in the setting of exogenous testosterone. Possible hemochromatosis. -- Follow  CBC -- Fe studies as above -- Outpt sleep study for OSA and CPAP trial  7) Hyperglycemia: CBG mildly elevated per PCP records at 175 on 04/01/2015, now 200s today. In the setting illness, will follow for now. A1c elevated at 6.8. -- Start metformin 500mg  BID (titrate to 1000mg  BID if tolerated)  8) Low T: Chronic for several years, on testosterone replacement injections q 2 wks with poor compliance. Recently, in the past month, began taking regularly as prescribed.  9) Anxiety: Recently d/c'd SSRI earlier this year. Currently asymptomatic.  FEN/GI: -- PO hydration, 2g sodium restrict diet -- NPO @ MN if Surgery planning Lap Chole tomorrow   DVT PPX - heparin  CODE STATUS - Full  CONSULTS PLACED - GI, Surgery, Cariology  DISPO - Disposition pending Lap Chole, will reassess following decision from Surgery on when to proceed with Lap Chole.  Anticipated discharge in approximately 1 day(s).   The patient does have a current  PCP (BRONSTEIN, DAVID, MD) and does need an [facility] hospital follow-up appointment after discharge.    Is the [facility] hospital follow-up appointment a one-time only appointment? no.  Does the patient have transportation limitations that hinder transportation to clinic appointments? yes   SERVICE NEEDED AT DISCHARGE - TO BE DETERMINED DURING HOSPITAL COURSE         Y = Yes, Blank = No PT:   OT:   RN:   Equipment:   Other:    Length of Stay: 5 day(s)  This is a Psychologist, occupational Note.  The care of the patient was discussed with Dr. Andrey Campanile and the assessment and plan formulated with their assistance.  Please see their attached note for official documentation of the daily encounter.  Burna Cash, MS4 Pager: [phone] (7AM-5PM) 05/16/2015, 1:11 PM

## 2015-05-16 NOTE — Progress Notes (Signed)
Patient taking to cardiac cath for procedure. Will not be returning to [facility]. Patient belongings packed and left at front desk with Diplomatic Services operational officer.

## 2015-05-16 NOTE — Progress Notes (Addendum)
IM called to inquire about clearance for GB surgery. D/w Dr. Clifton James. Since cath was clean, Dr. Clifton James feels he is OK to proceed with surgery tomorrow (provided no prohibitive events overnight). The patient did have elevated filling pressures on cath and elevated BP which is being addressed today with resumption of Losartan 100mg  BID and initiation of oral Lasix. Cardiology will plan to f/u in AM as well. Dayna Dunn PA-C

## 2015-05-17 ENCOUNTER — Other Ambulatory Visit: Payer: Self-pay | Admitting: *Deleted

## 2015-05-17 DIAGNOSIS — G4730 Sleep apnea, unspecified: Secondary | ICD-10-CM

## 2015-05-17 DIAGNOSIS — G4720 Circadian rhythm sleep disorder, unspecified type: Secondary | ICD-10-CM

## 2015-05-17 DIAGNOSIS — I272 Other secondary pulmonary hypertension: Secondary | ICD-10-CM

## 2015-05-17 DIAGNOSIS — F1010 Alcohol abuse, uncomplicated: Secondary | ICD-10-CM | POA: Insufficient documentation

## 2015-05-17 LAB — PROTEIN ELECTROPHORESIS, SERUM
A/G Ratio: 0.9 (ref 0.7–1.7)
ALBUMIN ELP: 3 g/dL (ref 2.9–4.4)
Alpha-1-Globulin: 0.2 g/dL (ref 0.0–0.4)
Alpha-2-Globulin: 0.8 g/dL (ref 0.4–1.0)
BETA GLOBULIN: 1.1 g/dL (ref 0.7–1.3)
GAMMA GLOBULIN: 1.2 g/dL (ref 0.4–1.8)
Globulin, Total: 3.3 g/dL (ref 2.2–3.9)
Total Protein ELP: 6.3 g/dL (ref 6.0–8.5)

## 2015-05-17 LAB — BASIC METABOLIC PANEL
ANION GAP: 6 (ref 5–15)
BUN: 11 mg/dL (ref 6–20)
CO2: 27 mmol/L (ref 22–32)
Calcium: 8.5 mg/dL — ABNORMAL LOW (ref 8.9–10.3)
Chloride: 101 mmol/L (ref 101–111)
Creatinine, Ser: 1.04 mg/dL (ref 0.61–1.24)
GFR calc Af Amer: >60 mL/min (ref >60)
GFR calc non Af Amer: >60 mL/min (ref >60)
Glucose, Bld: 99 mg/dL (ref 65–99)
POTASSIUM: 3.7 mmol/L (ref 3.5–5.1)
Sodium: 134 mmol/L — ABNORMAL LOW (ref 135–145)

## 2015-05-17 LAB — CBC
HEMATOCRIT: 47.4 % (ref 39.0–52.0)
Hemoglobin: 16.5 g/dL (ref 13.0–17.0)
MCH: 34.6 pg — ABNORMAL HIGH (ref 26.0–34.0)
MCHC: 34.8 g/dL (ref 30.0–36.0)
MCV: 99.4 fL (ref 78.0–100.0)
Platelets: 220 10*3/uL (ref 150–400)
RBC: 4.77 MIL/uL (ref 4.22–5.81)
RDW: 14.8 % (ref 11.5–15.5)
WBC: 6.3 10*3/uL (ref 4.0–10.5)

## 2015-05-17 LAB — ANTINUCLEAR ANTIBODIES, IFA: ANA Ab, IFA: NEGATIVE

## 2015-05-17 MED ORDER — AMLODIPINE BESYLATE 5 MG PO TABS: 5.0000 mg | ORAL_TABLET | Freq: Every day | ORAL | Status: DC

## 2015-05-17 MED ORDER — LIVING WELL WITH DIABETES BOOK
Freq: Once | Status: AC
  Administered 2015-05-17: 11:00:00
  Filled 2015-05-17: qty 1

## 2015-05-17 MED ORDER — CARVEDILOL 25 MG PO TABS: 25.0000 mg | ORAL_TABLET | Freq: Two times a day (BID) | ORAL | Status: DC

## 2015-05-17 MED ORDER — LOSARTAN POTASSIUM 100 MG PO TABS: 100.0000 mg | ORAL_TABLET | Freq: Every day | ORAL | Status: DC

## 2015-05-17 MED ORDER — AMLODIPINE BESYLATE 5 MG PO TABS
5.0000 mg | ORAL_TABLET | Freq: Every day | ORAL | Status: DC
  Administered 2015-05-17: 5 mg via ORAL
  Filled 2015-05-17: qty 1

## 2015-05-17 MED ORDER — POTASSIUM CHLORIDE CRYS ER 20 MEQ PO TBCR
20.0000 meq | EXTENDED_RELEASE_TABLET | Freq: Two times a day (BID) | ORAL | Status: DC
  Administered 2015-05-17: 20 meq via ORAL
  Filled 2015-05-17: qty 1

## 2015-05-17 MED ORDER — CARVEDILOL 12.5 MG PO TABS
12.5000 mg | ORAL_TABLET | ORAL | Status: AC
  Administered 2015-05-17: 10:00:00 12.5 mg via ORAL
  Filled 2015-05-17: qty 1

## 2015-05-17 MED ORDER — POTASSIUM CHLORIDE CRYS ER 20 MEQ PO TBCR: 20.0000 meq | EXTENDED_RELEASE_TABLET | Freq: Two times a day (BID) | ORAL | Status: DC

## 2015-05-17 MED ORDER — CARVEDILOL 12.5 MG PO TABS: 25.0000 mg | ORAL_TABLET | Freq: Two times a day (BID) | ORAL | Status: DC

## 2015-05-17 MED ORDER — FUROSEMIDE 40 MG PO TABS: 40.0000 mg | ORAL_TABLET | Freq: Two times a day (BID) | ORAL | Status: DC

## 2015-05-17 MED ORDER — LIVING BETTER WITH HEART FAILURE BOOK
Freq: Once | Status: AC
  Administered 2015-05-17: 10:00:00

## 2015-05-17 MED FILL — Nitroglycerin IV Soln 100 MCG/ML in D5W: INTRA_ARTERIAL | Qty: 10 | Status: AC

## 2015-05-17 NOTE — Plan of Care (Addendum)
Problem: Food- and Nutrition-Related Knowledge Deficit (NB-1.1) Goal: Nutrition education Formal process to instruct or train a patient/client in a skill or to impart knowledge to help patients/clients voluntarily manage or modify food choices and eating behavior to maintain or improve health. Outcome: Completed/Met Date Met:  05/17/15 Nutrition Education Note  RD consulted for nutrition education regarding a Low Fat diet.   RD provided "Fat Restricted" handout from the Academy [employer]. Reviewed patient's dietary recall. Provided examples on ways to decrease fat and sodium intake in diet. Discouraged intake of fried, processed foods and use of salt shaker. Encouraged fresh fruits and vegetables as well as whole grain sources of carbohydrates to maximize fiber intake.  Pt's wife in room and reports that she is disabled and cannot cook much, reviewed foods that are decreased work for her. Wife reports that pt will not heat up foods, etc however pt states he will do this. Also encouraged pt not to consume sugar-sweetened beverages and to eat 3 meals per day. Pt not interested in discussing DM diet at this time. . Pt feels that now that he is not drinking ETOH he will be fine.  Teach back method used.  Expect fair compliance.  Body mass index is 49.43 kg/(m^2). Pt meets criteria for morbid obesity class III based on current BMI.  Current diet order is Heart Healthy, patient is consuming approximately 100% of meals at this time. Labs and medications reviewed. No further nutrition interventions warranted at this time. RD contact information provided. If additional nutrition issues arise, please re-consult RD.  [employer], [place], [place] Pager [phone] After Hours Pager

## 2015-05-17 NOTE — Progress Notes (Signed)
Subjective: Bruce Alvarez was seen and examined this AM.   He feels ready to go home.  We discussed necessary lifestyle changes and medication compliance to improved cardiac function and reduces his chances of death.  He says he is agreeable to these changes.  Objective: Vital signs in last 24 hours: Filed Vitals:   05/17/15 0455 05/17/15 0510 05/17/15 0605 05/17/15 0713  BP:  190/106 188/83 165/100  Pulse:  82  74  Temp:  98.1 F (36.7 C)    TempSrc:  Oral    Resp:  14    Height:      Weight: 288 lb 2.3 oz (130.7 kg)     SpO2:  97%     Weight change: -4 lb 15.3 oz (-2.249 kg)  Intake/Output Summary (Last 24 hours) at 05/17/15 0746 Last data filed at 05/17/15 0515  Gross per 24 hour  Intake  527.5 ml  Output   4100 ml  Net -3572.5 ml   General: standing, in NAD HEENT:  [place]/AT, thick neck Cardiac: RRR, no rubs, murmurs or gallops Pulm: clear to auscultation bilaterally, moving normal volumes of air Abd: soft, non-tender, nondistended, BS present MSK:  Ambulating in room Neuro: alert and oriented X3, responding appropriately, MAE spontaneously  Lab Results: Basic Metabolic Panel:  Recent Labs Lab 05/16/15 0403 05/17/15 0550  NA 135 134*  K 4.4 3.7  CL 100* 101  CO2 27 27  GLUCOSE 119* 99  BUN 12 11  CREATININE 0.99 1.04  CALCIUM 8.3* 8.5*   Liver Function Tests:  Recent Labs Lab 05/15/15 0545 05/16/15 0403  AST 246* 134*  ALT 457* 357*  ALKPHOS 171* 180*  BILITOT 3.7* 2.4*  PROT 6.3* 6.6  ALBUMIN 3.1* 3.1*   CBC:  Recent Labs Lab 05/11/15 1015  05/13/15 0035  05/15/15 0545 05/17/15 0550  WBC 7.0  < > 7.1  < > 6.3 6.3  NEUTROABS 5.2  --  5.5  --   --   --   HGB 17.1*  < > 14.7  < > 14.8 16.5  HCT 50.0  < > 44.3  < > 44.3 47.4  MCV 98.8  < > 102.8*  < > 102.1* 99.4  PLT 197  < > 175  < > 197 220  < > = values in this interval not displayed.  Medications: I have reviewed the patient's current medications. Scheduled Meds: . antiseptic oral  rinse  7 mL Mouth Rinse q12n4p  . carvedilol  12.5 mg Oral BID WC  . chlorhexidine  15 mL Mouth Rinse BID  . Chlorhexidine Gluconate Cloth  6 each Topical Daily  . fentaNYL (SUBLIMAZE) injection  50 mcg Intravenous Once  . furosemide  40 mg Oral BID  . heparin  5,000 Units Subcutaneous 3 times per day  . losartan  100 mg Oral Daily  . mupirocin ointment  1 application Nasal BID   Continuous Infusions: none   PRN Meds:.acetaminophen, hydrALAZINE, HYDROmorphone (DILAUDID) injection, ondansetron **OR** ondansetron (ZOFRAN) IV, zolpidem   Assessment/Plan: 44 year old male with PMH of HTN, low testosterone presents with abdominal pain and found to have CBD stone.  Nonischemic cardiomyopathy:  EF 25-30% by 2D ECHO; cath clean.  Etiology is likely HTN, within re-initiation of testosterone likely contributing.   - Coreg 12.5mg  BID -->  BID, losartan  daily, lasix  BID; titrate as tolerated for afterload reduction - follow-up scheduled with cardiology on 08/17 for med maximization; outpatient 2D ECHO in a few months to  re-eval EF and decide if ICD is necessary - patient educated on importance of EtOH and tobacco cessation  OSA:  Dx is supported pulm HTN, patient's hgb 17.1 at admission, large neck circumference and wife says he snores and often has apneic periods while sleeping.   Testosterone treatments are likely worsening this. - advise sleep study at discharge --> cardiology note says they will schedule this - STOP testosterone  Pulmonary HTN:  Elevated pulm pressures on cath.  Likely due to cardiomyopathy and OSA. - addressing HF and OSA as above  Hypertension:  Stable. ARB and diuretic resumed.   - Coreg 12.5mg  BID --> 25mg  BID, losartan 100mg  daily, lasix 40mg  BID - amlodipine 5mg  daily added by cards  Hyperglycemia/probable diabetes:  A1c 6.8 and ideally should be repeated for confirmation.  Serum glucose 99 this AM.  - DM educator consulted to provide info prior to  d/c - follow-up with PCP after d/c - will need to make lifestyle changes and recheck A1c in 3 months - ideally will need to start moderate intensity statin, will hold off now due to elevated LFT (though downtrending) in the setting of choledocholithiasis; recommend starting atorvastatin 20mg  daily as an outpatient  Choledocholithiasis:  LFTs improving s/p ERCP.  He is currently asymptomatic.  Appreciate GI and surgical interventions and recommendations.  Will need outpatient elective surgery once other medical problems better controlled. - plan for outpatient elective lap chole - patient to schedule follow-up in 2-3 weeks  AKI:  Resolved.  Patient reported decrease po prior to admission so this was likely pre-renal.  - ARB resumed as above - monitor renal function/electrolytes as oupatient  Dispo: Medically stable for discharge home with close outpatient follow-up with PCP, cardiology and surgery.  The patient does have a current PCP Dorothey Baseman, MD) and does not need an [facility] hospital follow-up appointment after discharge.  The patient does not know have transportation limitations that hinder transportation to clinic appointments.  .Services Needed at time of discharge: Y = Yes, Blank = No PT:   OT:   RN:   Equipment:   Other:     LOS: 6 days   Yolanda Manges, DO 05/17/2015, 7:46 AM

## 2015-05-17 NOTE — Progress Notes (Signed)
[facility] Progress Note  1 Day Post-Op  Subjective: Pt feels good.  He discloses that he was drinking a lot every night before coming in.  He knows this needs to stop.  No N/V.  No abdominal pain.  Having BM's and flatus.  Tolerating diet.  Ambulating well.  Happy he can go home.  Objective: Vital signs in last 24 hours: Temp:  [97.5 F (36.4 C)-99.2 F (37.3 C)] 97.5 F (36.4 C) (08/09 0846) Pulse Rate:  [0-113] 81 (08/09 0846) Resp:  [0-23] 20 (08/09 0846) BP: (144-222)/(74-125) 184/108 mmHg (08/09 0846) SpO2:  [0 %-99 %] 95 % (08/09 0846) Weight:  [130.7 kg (288 lb 2.3 oz)] 130.7 kg (288 lb 2.3 oz) (08/09 0455) Last BM Date: 05/15/15  Intake/Output from previous day: 08/08 0701 - 08/09 0700 In: 527.5 [P.O.:320; I.V.:207.5] Out: 4100 [Urine:4100] Intake/Output this shift: Total I/O In: 240 [P.O.:240] Out: -   PE: Gen:  Alert, NAD, pleasant Abd: obese, soft, NT/ND, +BS  Lab Results:   Recent Labs  05/15/15 0545 05/17/15 0550  WBC 6.3 6.3  HGB 14.8 16.5  HCT 44.3 47.4  PLT 197 220   BMET  Recent Labs  05/16/15 0403 05/17/15 0550  NA 135 134*  K 4.4 3.7  CL 100* 101  CO2 27 27  GLUCOSE 119* 99  BUN 12 11  CREATININE 0.99 1.04  CALCIUM 8.3* 8.5*   PT/INR  Recent Labs  05/16/15 0403  LABPROT 13.4  INR 1.00   CMP     Component Value Date/Time   NA 134* 05/17/2015 0550   K 3.7 05/17/2015 0550   CL 101 05/17/2015 0550   CO2 27 05/17/2015 0550   GLUCOSE 99 05/17/2015 0550   BUN 11 05/17/2015 0550   CREATININE 1.04 05/17/2015 0550   CALCIUM 8.5* 05/17/2015 0550   PROT 6.6 05/16/2015 0403   ALBUMIN 3.1* 05/16/2015 0403   AST 134* 05/16/2015 0403   ALT 357* 05/16/2015 0403   ALKPHOS 180* 05/16/2015 0403   BILITOT 2.4* 05/16/2015 0403   GFRNONAA >60 05/17/2015 0550   GFRAA >60 05/17/2015 0550   Lipase     Component Value Date/Time   LIPASE 18* 05/14/2015 0339       Studies/Results: Dg Chest 2 View  05/16/2015    CLINICAL DATA:  44 year old male with CHF and cardiomyopathy  EXAM: CHEST  2 VIEW  COMPARISON:  Prior chest x-ray 10/29/2013 (report only)  FINDINGS: Cardiomegaly. Mild pulmonary vascular congestion without overt edema. Trace bilateral pleural effusions. No focal airspace consolidation. Osseous structures are intact and unremarkable.  IMPRESSION: 1. Cardiomegaly and mild pulmonary vascular congestion without overt pulmonary edema or evidence of failure. 2. Trace bilateral pleural effusions.   Electronically Signed   By: Jacqulynn Cadet M.D.   On: 05/16/2015 18:36    Anti-infectives: Anti-infectives    Start     Dose/Rate Route Frequency Ordered Stop   05/12/15 1200  ciprofloxacin (CIPRO) IVPB 400 mg  Status:  Discontinued     400 mg 200 mL/hr over 60 Minutes Intravenous  Once 05/12/15 1153 05/12/15 1455       Assessment/Plan Choledocholithiasis s/p ERCP with sphincterotomy -Eating low salt diet -Bili and transaminases improved, alk phos slightly up -Pain resolved, does not have evidence of cholecystitis -Ordered dietitian consult to review low fat diet to minimize risk of GB attacks Elevated troponin's -Echo showed EF of 25%, Cath clean, 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 Donne Hazel as an outpatient  for delayed lap chole in 2-3 weeks. -Recommend weekly LFT's after discharge to ensure values are trending down. Elevated Alk phos Hyperbilirubinemia  Transaminitis      LOS: 6 days    Nat Christen 05/17/2015, 8:55 AM Pager: [phone]

## 2015-05-17 NOTE — Discharge Instructions (Addendum)
You were hospitalized for management of your gall stones. It was noted that your bile duct was obstructed by a large gall stone, which was causing your pain and other symptoms. This stone was removed by GI using an endoscopic procedure. You will follow up with the surgery team in 2-3 weeks to have your gall bladder removed in order to prevent a recurrence of this problem.  While you were in the hospital, you were found to have an abnormality with your heart. We preformed several tests, including an ultrasound of the heart and a catheterization of the heart, in order to investigate these issues. It was discovered that your heart's ability to pump is significantly decreased. It is currently only pumping at 25-30% efficiency which is less than half of the normal function. The catheterization revealed no signs of blockage in your arteries. As a result, it is most likely that your heart has been damaged by your hight blood pressures.  This condition, known as cardiomyopathy and heart failure, is very significant. With reduced pumping, your heart is at significant risk of an abnormal rhythm, which can cause death. Without treatment, you have a 50% chance death in the next two years. As a result, it is critical that you work with your doctors to address these problems and are compliant with your medications.  There are several important things that you can do to improve your hearts function: 1)  Stop drinking alcohol - alcohol can raise your blood pressure and damage your heart further 2)  Begin to exercise regularly - 30 minutes of activity 5 times each week will significantly increase your heart strength 3)  Change your diet - cut back on fats and salt heavy food, as these can contribute to heart disease and raise your blood pressure  We will also be changing your medications to lower your blood pressure and protect your heart: 1) Stop taking your Hyzaar (losartan/HCTZ 100-25mg ), this will be replaced with  Losartan 100mg  daily 2) Start taking Coreg (carvedilol) 25mg  two times a day 3) Start taking Lasix (furosemide) 40mg  two times a day, you will also take a potassium supplement two times a day to replenish your potassium   Heart Failure Heart failure is a condition in which the heart has trouble pumping blood. This means your heart does not pump blood efficiently for your body to work well. In some cases of heart failure, fluid may back up into your lungs or you may have swelling (edema) in your lower legs. Heart failure is usually a long-term (chronic) condition. It is important for you to take good care of yourself and follow your health care provider's treatment plan. CAUSES  Some health conditions can cause heart failure. Those health conditions include:  High blood pressure (hypertension). Hypertension causes the heart muscle to work harder than normal. When pressure in the blood vessels is high, the heart needs to pump (contract) with more force in order to circulate blood throughout the body. High blood pressure eventually causes the heart to become stiff and weak.  Coronary artery disease (CAD). CAD is the buildup of cholesterol and fat (plaque) in the arteries of the heart. The blockage in the arteries deprives the heart muscle of oxygen and blood. This can cause chest pain and may lead to a heart attack. High blood pressure can also contribute to CAD.  Heart attack (myocardial infarction). A heart attack occurs when one or more arteries in the heart become blocked. The loss of oxygen damages the muscle  tissue of the heart. When this happens, part of the heart muscle dies. The injured tissue does not contract as well and weakens the heart's ability to pump blood.  Abnormal heart valves. When the heart valves do not open and close properly, it can cause heart failure. This makes the heart muscle pump harder to keep the blood flowing.  Heart muscle disease (cardiomyopathy or myocarditis).  Heart muscle disease is damage to the heart muscle from a variety of causes. These can include drug or alcohol abuse, infections, or unknown reasons. These can increase the risk of heart failure.  Lung disease. Lung disease makes the heart work harder because the lungs do not work properly. This can cause a strain on the heart, leading it to fail.  Diabetes. Diabetes increases the risk of heart failure. High blood sugar contributes to high fat (lipid) levels in the blood. Diabetes can also cause slow damage to tiny blood vessels that carry important nutrients to the heart muscle. When the heart does not get enough oxygen and food, it can cause the heart to become weak and stiff. This leads to a heart that does not contract efficiently.  Other conditions can contribute to heart failure. These include abnormal heart rhythms, thyroid problems, and low blood counts (anemia). Certain unhealthy behaviors can increase the risk of heart failure, including:  Being overweight.  Smoking or chewing tobacco.  Eating foods high in fat and cholesterol.  Abusing illicit drugs or alcohol.  Lacking physical activity. SYMPTOMS  Heart failure symptoms may vary and can be hard to detect. Symptoms may include:  Shortness of breath with activity, such as climbing stairs.  Persistent cough.  Swelling of the feet, ankles, legs, or abdomen.  Unexplained weight gain.  Difficulty breathing when lying flat (orthopnea).  Waking from sleep because of the need to sit up and get more air.  Rapid heartbeat.  Fatigue and loss of energy.  Feeling light-headed, dizzy, or close to fainting.  Loss of appetite.  Nausea.  Increased urination during the night (nocturia). DIAGNOSIS  A diagnosis of heart failure is based on your history, symptoms, physical examination, and diagnostic tests. Diagnostic tests for heart failure may include:  Echocardiography.  Electrocardiography.  Chest X-ray.  Blood  tests.  Exercise stress test.  Cardiac angiography.  Radionuclide scans. TREATMENT  Treatment is aimed at managing the symptoms of heart failure. Medicines, behavioral changes, or surgical intervention may be necessary to treat heart failure.  Medicines to help treat heart failure may include:  Angiotensin-converting enzyme (ACE) inhibitors. This type of medicine blocks the effects of a blood protein called angiotensin-converting enzyme. ACE inhibitors relax (dilate) the blood vessels and help lower blood pressure.  Angiotensin receptor blockers (ARBs). This type of medicine blocks the actions of a blood protein called angiotensin. Angiotensin receptor blockers dilate the blood vessels and help lower blood pressure.  Water pills (diuretics). Diuretics cause the kidneys to remove salt and water from the blood. The extra fluid is removed through urination. This loss of extra fluid lowers the volume of blood the heart pumps.  Beta blockers. These prevent the heart from beating too fast and improve heart muscle strength.  Digitalis. This increases the force of the heartbeat.  Healthy behavior changes include:  Obtaining and maintaining a healthy weight.  Stopping smoking or chewing tobacco.  Eating heart-healthy foods.  Limiting or avoiding alcohol.  Stopping illicit drug use.  Physical activity as directed by your health care provider.  Surgical treatment for heart failure  may include:  A procedure to open blocked arteries, repair damaged heart valves, or remove damaged heart muscle tissue.  A pacemaker to improve heart muscle function and control certain abnormal heart rhythms.  An internal cardioverter defibrillator to treat certain serious abnormal heart rhythms.  A left ventricular assist device (LVAD) to assist the pumping ability of the heart. HOME CARE INSTRUCTIONS   Take medicines only as directed by your health care provider. Medicines are important in reducing  the workload of your heart, slowing the progression of heart failure, and improving your symptoms.  Do not stop taking your medicine unless directed by your health care provider.  Do not skip any dose of medicine.  Refill your prescriptions before you run out of medicine. Your medicines are needed every day.  Engage in moderate physical activity if directed by your health care provider. Moderate physical activity can benefit some people. The elderly and people with severe heart failure should consult with a health care provider for physical activity recommendations.  Eat heart-healthy foods. Food choices should be free of trans fat and low in saturated fat, cholesterol, and salt (sodium). Healthy choices include fresh or frozen fruits and vegetables, fish, lean meats, legumes, fat-free or low-fat dairy products, and whole grain or high fiber foods. Talk to a dietitian to learn more about heart-healthy foods.  Limit sodium if directed by your health care provider. Sodium restriction may reduce symptoms of heart failure in some people. Talk to a dietitian to learn more about heart-healthy seasonings.  Use healthy cooking methods. Healthy cooking methods include roasting, grilling, broiling, baking, poaching, steaming, or stir-frying. Talk to a dietitian to learn more about healthy cooking methods.  Limit fluids if directed by your health care provider. Fluid restriction may reduce symptoms of heart failure in some people.  Weigh yourself every day. Daily weights are important in the early recognition of excess fluid. You should weigh yourself every morning after you urinate and before you eat breakfast. Wear the same amount of clothing each time you weigh yourself. Record your daily weight. Provide your health care provider with your weight record.  Monitor and record your blood pressure if directed by your health care provider.  Check your pulse if directed by your health care provider.  Lose  weight if directed by your health care provider. Weight loss may reduce symptoms of heart failure in some people.  Stop smoking or chewing tobacco. Nicotine makes your heart work harder by causing your blood vessels to constrict. Do not use nicotine gum or patches before talking to your health care provider.  Keep all follow-up visits as directed by your health care provider. This is important.  Limit alcohol intake to no more than 1 drink per day for nonpregnant women and 2 drinks per day for men. One drink equals 12 ounces of beer, 5 ounces of wine, or 1 ounces of hard liquor. Drinking more than that is harmful to your heart. Tell your health care provider if you drink alcohol several times a week. Talk with your health care provider about whether alcohol is safe for you. If your heart has already been damaged by alcohol or you have severe heart failure, drinking alcohol should be stopped completely.  Stop illicit drug use.  Stay up-to-date with immunizations. It is especially important to prevent respiratory infections through current pneumococcal and influenza immunizations.  Manage other health conditions such as hypertension, diabetes, thyroid disease, or abnormal heart rhythms as directed by your health care provider.  Learn to manage stress.  Plan rest periods when fatigued.  Learn strategies to manage high temperatures. If the weather is extremely hot:  Avoid vigorous physical activity.  Use air conditioning or fans or seek a cooler location.  Avoid caffeine and alcohol.  Wear loose-fitting, lightweight, and light-colored clothing.  Learn strategies to manage cold temperatures. If the weather is extremely cold:  Avoid vigorous physical activity.  Layer clothes.  Wear mittens or gloves, a hat, and a scarf when going outside.  Avoid alcohol.  Obtain ongoing education and support as needed.  Participate in or seek rehabilitation as needed to maintain or improve  independence and quality of life. SEEK MEDICAL CARE IF:   Your weight increases by 03 lb/1.4 kg in 1 day or 05 lb/2.3 kg in a week.  You have increasing shortness of breath that is unusual for you.  You are unable to participate in your usual physical activities.  You tire easily.  You cough more than normal, especially with physical activity.  You have any or more swelling in areas such as your hands, feet, ankles, or abdomen.  You are unable to sleep because it is hard to breathe.  You feel like your heart is beating fast (palpitations).  You become dizzy or light-headed upon standing up. SEEK IMMEDIATE MEDICAL CARE IF:   You have difficulty breathing.  There is a change in mental status such as decreased alertness or difficulty with concentration.  You have a pain or discomfort in your chest.  You have an episode of fainting (syncope). MAKE SURE YOU:   Understand these instructions.  Will watch your condition.  Will get help right away if you are not doing well or get worse. Document Released: 09/24/2005 Document Revised: 02/08/2014 Document Reviewed: 10/24/2012 [facility] Patient Information 2015 [place], [place]. This information is not intended to replace advice given to you by your health care provider. Make sure you discuss any questions you have with your health care provider.

## 2015-05-17 NOTE — Progress Notes (Signed)
Subjective:    Currently feeling well and ready for discharge. No complaints. Feeling stir crazy and feels he would better recover at home. Is appropriately concerned about new found HF and had a long discussion about the need to address lifestyle modifications such as alcohol abstinence and diet/exercise, and the importance of excellent medical compliance with meds, OSA therapy, and follow-up.  Interval Events: - Tachy and hypertensive O/N requiring multiple PRN hydral - likely 2/2 alcohol withdrawal, but given suspicion of hypertension contributing to cardiomyopathy, will manage aggressively on discharge with close follow-up. - Cardiomyopathy w/u negative at this point. F/u pending labs.   Objective:    Vital Signs:   Temp:  [97.8 F (36.6 C)-99.2 F (37.3 C)] 98.1 F (36.7 C) (08/09 0510) Pulse Rate:  [0-113] 74 (08/09 0713) Resp:  [0-23] 14 (08/09 0510) BP: (144-222)/(74-133) 165/100 mmHg (08/09 0713) SpO2:  [0 %-99 %] 97 % (08/09 0510) Weight:  [130.7 kg (288 lb 2.3 oz)] 130.7 kg (288 lb 2.3 oz) (08/09 0455) Last BM Date: 05/15/15  Intake/Output:   Intake/Output Summary (Last 24 hours) at 05/17/15 0817 Last data filed at 05/17/15 0515  Gross per 24 hour  Intake  527.5 ml  Output   4100 ml  Net -3572.5 ml      Physical Exam: General: Vital signs reviewed and noted. Well-developed, well-nourished, anxious and somewhat sweaty, did not want to sit during the interview; alert, appropriate and cooperative throughout examination.  Lungs:  Normal respiratory effort. Clear to auscultation BL without crackles or wheezes.  Heart: RRR. S1 and S2 normal without gallop, murmur, or rubs.  Abdomen:  BS normoactive. Obese, non-tender.  No masses or organomegaly.  Extremities: 1+ pretibial edema bilaterally. Venous stasis changes vs hemosiderin deposition in the LE bilaterally     Labs:  Basic Metabolic Panel:  Recent Labs Lab 05/14/15 0339 05/14/15 1230 05/15/15 0545  05/16/15 0403 05/17/15 0550  NA 134* 136 134* 135 134*  K 5.4* 4.7 4.3 4.4 3.7  CL 101 103 100* 100* 101  CO2 26 22 26 27 27   GLUCOSE 146* 117* 114* 119* 99  BUN 10 12 13 12 11   CREATININE 1.17 1.13 1.01 0.99 1.04  CALCIUM 7.9* 7.9* 7.8* 8.3* 8.5*    Liver Function Tests:  Recent Labs Lab 05/12/15 0351 05/13/15 0035 05/14/15 0339 05/15/15 0545 05/16/15 0403  AST 428* 289* 276* 246* 134*  ALT 400* 360* 403* 457* 357*  ALKPHOS 143* 146* 162* 171* 180*  BILITOT 4.8* 6.4* 7.3* 3.7* 2.4*  PROT 7.4 6.6 6.3* 6.3* 6.6  ALBUMIN 3.8 3.4* 3.1* 3.1* 3.1*    Recent Labs Lab 05/11/15 1015 05/12/15 0909 05/13/15 0035 05/14/15 0339  LIPASE 172* 81* 19* 18*   CBC:  Recent Labs Lab 05/11/15 1015 05/12/15 0351 05/13/15 0035 05/14/15 0339 05/15/15 0545 05/17/15 0550  WBC 7.0 12.0* 7.1 6.8 6.3 6.3  NEUTROABS 5.2  --  5.5  --   --   --   HGB 17.1* 17.0 14.7 15.1 14.8 16.5  HCT 50.0 50.1 44.3 46.0 44.3 47.4  MCV 98.8 102.5* 102.8* 102.7* 102.1* 99.4  PLT 197 294 175 179 197 220    Cardiac Enzymes:  Recent Labs Lab 05/11/15 1015 05/11/15 2143 05/12/15 1630 05/12/15 2105 05/13/15 0035  TROPONINI 0.04* 0.06* 0.19* 0.18* 0.14*    Imaging: Dg Chest 2 View  05/16/2015   CLINICAL DATA:  44 year old male with CHF and cardiomyopathy  EXAM: CHEST  2 VIEW  COMPARISON:  Prior chest x-ray  10/29/2013 (report only)  FINDINGS: Cardiomegaly. Mild pulmonary vascular congestion without overt edema. Trace bilateral pleural effusions. No focal airspace consolidation. Osseous structures are intact and unremarkable.  IMPRESSION: 1. Cardiomegaly and mild pulmonary vascular congestion without overt pulmonary edema or evidence of failure. 2. Trace bilateral pleural effusions.   Electronically Signed   By: Malachy Moan M.D.   On: 05/16/2015 18:36     Medications:    Infusions:    Scheduled Medications: . antiseptic oral rinse  7 mL Mouth Rinse q12n4p  . carvedilol  12.5 mg Oral BID  WC  . chlorhexidine  15 mL Mouth Rinse BID  . Chlorhexidine Gluconate Cloth  6 each Topical Daily  . fentaNYL (SUBLIMAZE) injection  50 mcg Intravenous Once  . furosemide  40 mg Oral BID  . heparin  5,000 Units Subcutaneous 3 times per day  . losartan  100 mg Oral Daily  . mupirocin ointment  1 application Nasal BID    PRN Medications: acetaminophen, hydrALAZINE, HYDROmorphone (DILAUDID) injection, ondansetron **OR** ondansetron (ZOFRAN) IV, zolpidem   Assessment/ Plan:    Pt is a 44 y.o. yo male with a PMHx of HTN and Low T who was admitted on 05/11/2015 with symptoms of acute epigastric pain, which was determined to be secondary to choledocolithiasis. He was subsequently found to have a troponin leak with echo demonstrating reduced LVEF at 25-30%. Interventions at this time will be focused on evaluation and management of his CHF, with a plan for cholecystectomy when medically stable.   1) Choledocolithiasis: Stable s/p ERCP. No pain. AlkPhos continues to trend upward. Transaminases downtrending, TBili 2.4 (peak at 7.3). Non-jaundiced, non-icteric. Afeb w/o leukocytosis. Concern for another obstruction given rising AlkPhos, will consider second ERCP after Lap Chole with IOC. -- GI consulted, s/p ERCP, appreciate recs:  -- sign off -- Surg consulted, awaiting recs:  -- Delayed lap chole in the setting of low EF cardiomyopathy (outpt f/u in 2-3 weeks w/ Dr. Dwain Sarna)  -- Recommend weekly LFTs to monitor until f/u  2) Cardiomyopathy w/ CHF: Recently diagnosed on echo demonstrating LVEF of 25-30%. Heart cath shows pulm HTN and volume overload. Unlikely ischemic given heart cath negative for CAD. Consider familial dilated cardiomyopathy vs hypertensive cardiomyopathy vs alcoholic vs stress induced vs OSA. Infiltrative, infectious, and autoimmune w/u underway, but negative to date. BP uncontrolled on admission in the 220s/110s, likely exacerbated by 7 days of alcohol withdrawl. Appears euvolemic  on exam: Lungs CTAB, 1+ pedal edema, no SOB/orthopnea/PND. -- Cards consulted, appreciate recs:  -- Echo with LVEF 25-30%, grade 1 dCHF, LVH, hypokinesis, dilated LA, RV, RA  -- Cath showing no CAD, + for pulm HTN, + for elevated LV EDP  -- Increase carvedilol from 12.5 to 25mg  BID, titrate to pulse as outpt  -- Continue losartan 100mg  (hold HCTZ 25mg )  -- Add amlodipine 5mg  (consider transition to hydral/nitrates as outpt)  -- Continue diuresis with 40mg  IV Lasix BID, titrate to fluid status as outpt  -- KCl BID for lasix niave K repletion, consider adding spirono outpt  -- tight control of BP to <130/80  -- 2 L fluid restric/2g sodium restric, daily wts  -- hold statin in the setting of transaminitis -- w/u dilated/restrictive cardiomyopathy  -- Fe Panel (Neg), TSH (Neg), HIV (Neg), ANA (pending), SPEP (pending), ACE (pending)  -- CXR shows pulm vasc conjestion and trace edema  3) Hypertension: See above. Likely exacerbated by EtOH withdrawal.  4) Alcohol abuse: Currently stable with some BP lability and  mild tachy. Drinks 14 shots of liquor weekly. -- CIWA protocol -- Strongly encourage abstinence in the setting of comorbitities  5) AKI: Resolved (Cr peak 1.39, likely 2/2 to dehydration). Baseline renal function Cr. 0.80.  6) Pulm HTN / suspected OSA / elevated Hgb: Habitus and history suggest OSA. In the setting of recent HF dx, increased right heart pressure likely due to nocturnal hypoxia. Hgb normalized. On exogenous testosterone which can exacerbate his symptoms. -- Outpt sleep study for OSA and CPAP trial, Cardiology to follow -- d/c testosterone  7) t2DM / Hyperglycemia: CBG mildly elevated per PCP records at 175 on 04/01/2015, now 200s today. In the setting illness, will follow for now. A1c elevated at 6.8. ASCVD risk indicates need for high intensity statin. -- f/u outpt w/ lifestyle/diet modifications -- consider statin as outpt after LFTs normalize  8) Low T:  Chronic for several years, on testosterone replacement injections q 2 wks with poor compliance. Recently, in the past month, began taking regularly as prescribed. -- d/c testosterone until co-morbidities undercontrol  9) Anxiety: Recently d/c'd SSRI earlier this year. Currently asymptomatic. -- Xanax PRN acute anxiety  FEN/GI: -- PO hydration, 2g sodium restrict diet   DVT PPX - heparin  CODE STATUS - Full  CONSULTS PLACED - GI, Surgery, Cariology  DISPO - Discharge today w/ outpt f/u for Cardiology and GI. Surgery to follow outpt for elective lap chole when medically stabilized.  Anticipated discharge in approximately 0 day(s).   The patient does have a current PCP (BRONSTEIN, DAVID, MD) and does need an [facility] hospital follow-up appointment after discharge.    Is the [facility] hospital follow-up appointment a one-time only appointment? no.  Does the patient have transportation limitations that hinder transportation to clinic appointments? yes   SERVICE NEEDED AT DISCHARGE - TO BE DETERMINED DURING HOSPITAL COURSE         Y = Yes, Blank = No PT:   OT:   RN:   Equipment:   Other:    Length of Stay: 6 day(s)  This is a Psychologist, occupational Note.  The care of the patient was discussed with Dr. Andrey Campanile and the assessment and plan formulated with their assistance.  Please see their attached note for official documentation of the daily encounter.  Burna Cash, MS4 Pager: [phone] (7AM-5PM) 05/17/2015, 8:17 AM

## 2015-05-17 NOTE — Progress Notes (Signed)
Patient: Bruce Alvarez / Admit Date: 05/11/2015 / Date of Encounter: 05/17/2015, 8:45 AM   Subjective: Long discussion with patient. He has been abstinent from alcohol for 7 days and feels this is contributing to anxiety and high blood pressure.    Objective: Telemetry: NSR/SB (HR upper 40s overnight briefly) Physical Exam: Blood pressure 165/100, pulse 74, temperature 98.1 F (36.7 C), temperature source Oral, resp. rate 14, height 5\' 4"  (1.626 m), weight 288 lb 2.3 oz (130.7 kg), SpO2 97 %. General: Well developed obese WM in no acute distress. Head: Normocephalic, atraumatic, sclera non-icteric, no xanthomas, nares are without discharge. Neck: Negative for carotid bruits. JVP not elevated. Lungs: Clear bilaterally to auscultation without wheezes, rales, or rhonchi. Breathing is unlabored. Heart: RRR S1 S2 without murmurs, rubs, or gallops.  Abdomen: Soft, non-tender, non-distended with normoactive bowel sounds. No rebound/guarding. Extremities: No clubbing or cyanosis. No edema. Distal pedal pulses are 2+ and equal bilaterally. Neuro: Alert and oriented X 3. Moves all extremities spontaneously. Psych:  Responds to questions appropriately with a normal affect.   Intake/Output Summary (Last 24 hours) at 05/17/15 0845 Last data filed at 05/17/15 0515  Gross per 24 hour  Intake  527.5 ml  Output   4100 ml  Net -3572.5 ml    Inpatient Medications:  . antiseptic oral rinse  7 mL Mouth Rinse q12n4p  . carvedilol  12.5 mg Oral BID WC  . chlorhexidine  15 mL Mouth Rinse BID  . Chlorhexidine Gluconate Cloth  6 each Topical Daily  . fentaNYL (SUBLIMAZE) injection  50 mcg Intravenous Once  . furosemide  40 mg Oral BID  . heparin  5,000 Units Subcutaneous 3 times per day  . losartan  100 mg Oral Daily  . mupirocin ointment  1 application Nasal BID   Infusions:    Labs:  Recent Labs  05/16/15 0403 05/17/15 0550  NA 135 134*  K 4.4 3.7  CL 100* 101  CO2 27 27  GLUCOSE 119* 99    BUN 12 11  CREATININE 0.99 1.04  CALCIUM 8.3* 8.5*    Recent Labs  05/15/15 0545 05/16/15 0403  AST 246* 134*  ALT 457* 357*  ALKPHOS 171* 180*  BILITOT 3.7* 2.4*  PROT 6.3* 6.6  ALBUMIN 3.1* 3.1*    Recent Labs  05/15/15 0545 05/17/15 0550  WBC 6.3 6.3  HGB 14.8 16.5  HCT 44.3 47.4  MCV 102.1* 99.4  PLT 197 220   No results for input(s): CKTOTAL, CKMB, TROPONINI in the last 72 hours. Invalid input(s): POCBNP No results for input(s): HGBA1C in the last 72 hours.   Radiology/Studies:  Dg Chest 2 View  05/16/2015   CLINICAL DATA:  44 year old male with CHF and cardiomyopathy  EXAM: CHEST  2 VIEW  COMPARISON:  Prior chest x-ray 10/29/2013 (report only)  FINDINGS: Cardiomegaly. Mild pulmonary vascular congestion without overt edema. Trace bilateral pleural effusions. No focal airspace consolidation. Osseous structures are intact and unremarkable.  IMPRESSION: 1. Cardiomegaly and mild pulmonary vascular congestion without overt pulmonary edema or evidence of failure. 2. Trace bilateral pleural effusions.   Electronically Signed   By: Malachy Moan M.D.   On: 05/16/2015 18:36   US Abdomen Limited  05/11/2015   CLINICAL DATA:  Epigastric pain  EXAM: US ABDOMEN LIMITED - RIGHT UPPER QUADRANT  COMPARISON:  MRCP 04/02/10  FINDINGS: Gallbladder:  Contracted gallbladder with multiple gallstones. There is mild thickening of gallbladder wall up to 3.2 mm. No sonographic Murphy's sign.  Common bile  duct:  Diameter: There is distended CBD up to 10.8 mm. Probable stone within CBD measures at least 1.2 cm. Further correlation with ERCP or MRCP could be performed.  Liver:  There is diffuse increased echogenicity of the liver. Mild intrahepatic biliary ductal dilatation. No focal hepatic mass.  IMPRESSION: Contracted gallbladder. Multiple gallstones are noted within gallbladder. There is CBD dilatation up to 10.8 mm. Probable stone within CBD measures at least 1.2 cm. Diffuse increased  echogenicity of the liver consistent with fatty infiltration. Mild intrahepatic biliary ductal dilatation.  These results were called by telephone at the time of interpretation on 05/11/2015 at 12:08 pm to Dr. Gerhard Munch , who verbally acknowledged these results.   Electronically Signed   By: Natasha Mead M.D.   On: 05/11/2015 12:08   Dg Ercp With Sphincterotomy  05/12/2015   CLINICAL DATA:  44 year old male with choledocholithiasis  EXAM: ERCP  TECHNIQUE: Multiple spot images obtained with the fluoroscopic device and submitted for interpretation post-procedure.  FLUOROSCOPY TIME:  7 minutes 17 seconds reported. Please see GI operative note for further detail.  COMPARISON:  Abdominal ultrasound 05/11/2015  FINDINGS: Two intraoperative spot images demonstrate a flexible endoscope in the descending duodenum and cannulation of the common bile duct. There is mild biliary ductal dilatation. The cystic duct is patent. Multiple filling defects are present within the gallbladder consistent with cholelithiasis. The second image demonstrates a sphincterotomy device in place.  IMPRESSION: ERCP with sphincterotomy as above.  Cholelithiasis with patent cystic duct.  These images were submitted for radiologic interpretation only. Please see the procedural report for the amount of contrast and the fluoroscopy time utilized.   Electronically Signed   By: Malachy Moan M.D.   On: 05/12/2015 15:50     Assessment and Plan   1. Acute cholecystitis/choledocolithiasis - cleared for surgery from cardiac standpoint given normal cath per Dr. Clifton James (IM team made aware yesterday). Per IM this will be done as outpatient.  2. 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 Cath 8/8 with normal cors, severe pulm HTN, elevated LVEDP. Etiology may be due to hypertensive heart disease. Consider IM workup for hemochromatosis (LVH, DM,  hypoandrogenism, although liver abnormalities may have other etiology). HIV neg. Home Losartan resumed yesterday and oral Lasix started with good diuresis. However, BP remains significantly elevated but patient is strongly convinced that this is tied to alcohol withdrawal and "being cooped up in these 4 walls." He is insistent that his BP will improve once he goes home. He has required multiple doses of IV hydralazine 20mg  PRN since yesterday. Will increase Coreg to 25mg  BID today. Will discuss plan for discharge with MD. The patient does not seem willing to stay in the hospital any longer but does report he is willing to monitor BP at home and call if running higher - at which time we would probably escalate medical therapy with addition of spiro, hydralazine, nitrates before next appt. Add KCl BID. We will plan to recheck labs as outpatient. Order CHF book to bedside. Discussed importance of salt/fluid restriction and daily weights.  3. HTN - see above.  4. Morbid obesity - Body mass index is 49.43 kg/(m^2). with suspected OSA - needs sleep study arranged at discharge per IM. We discussed importance of weight reduction in overall health.  5. Hyperlipidemia - LDL 120. With DM would benefit from statin but would not start right now with transaminitis.  6. Diabetes mellitus - per IM.  7. Alcohol abuse - patient seems extremely motivated to remain abstinent.   Transition of care visit scheduled 8/17 at 8am with me in flex clinic.  Signed, Ronie Spies PA-C Pager: [phone]      Patient seen and examined. Agree with assessment and plan. No significant CAD at cath. I had a long discussion with patient. He is committed to be off ETOH. I suspect that he has severe OSA which has also been exacerbated by his ETOH of 375 ml bourbon daily. I will schedule for a split night sleep study and can follow his OSA. Will add amlodipine 5 mg for improved BP control. No statin presently with significant  transaminitis.   Lennette Bihari, MD, [facility] 05/17/2015 10:48 AM

## 2015-05-17 NOTE — Progress Notes (Signed)
Inpatient Diabetes Program Recommendations  AACE/ADA: New Consensus Statement on Inpatient Glycemic Control (2013)  Target Ranges:  Prepandial:   less than 140 mg/dL      Peak postprandial:   less than 180 mg/dL (1-2 hours)      Critically ill patients:  140 - 180 mg/dL   Reason for Visit: Referral received. New onset diabetes. Results for REYNOLD, SCHLAG (MRN [id number]) as of 05/17/2015 10:59  Ref. Range 05/13/2015 00:35  Hemoglobin A1C Latest Ref Range: 4.8-5.6 % 6.8 (H)   Spoke briefly with patient and wife regarding new diagnosis.  They are aware of the A1C results and patient seems motivated to make changes in his lifestyle.  He states that he thinks that stopping drinking will help his blood sugars.  He states that he is active with his job.  Wife states that they are interested in getting a new PCP after discharge.  May consider ordering glucose meter at discharge for random monitoring of blood sugars after discharge.  He is not interested in attending outpatient diabetes education classes.    Thanks, Beryl Meager, RN, BC-ADM Inpatient Diabetes Coordinator Pager [phone] (8a-5p)

## 2015-05-18 LAB — ANGIOTENSIN CONVERTING ENZYME: Angiotensin-Converting Enzyme: 73 U/L (ref 14–82)

## 2015-05-24 ENCOUNTER — Encounter: Payer: Self-pay | Admitting: Physician Assistant

## 2015-05-24 DIAGNOSIS — I5042 Chronic combined systolic (congestive) and diastolic (congestive) heart failure: Secondary | ICD-10-CM | POA: Insufficient documentation

## 2015-05-24 DIAGNOSIS — K819 Cholecystitis, unspecified: Secondary | ICD-10-CM | POA: Insufficient documentation

## 2015-05-24 DIAGNOSIS — I272 Pulmonary hypertension, unspecified: Secondary | ICD-10-CM | POA: Insufficient documentation

## 2015-05-24 DIAGNOSIS — E11A Type 2 diabetes mellitus without complications in remission: Secondary | ICD-10-CM | POA: Insufficient documentation

## 2015-05-24 DIAGNOSIS — E669 Obesity, unspecified: Secondary | ICD-10-CM | POA: Insufficient documentation

## 2015-05-24 DIAGNOSIS — E119 Type 2 diabetes mellitus without complications: Secondary | ICD-10-CM | POA: Insufficient documentation

## 2015-05-24 NOTE — Progress Notes (Signed)
Cardiology Office Note Date:  05/25/2015  Patient ID:  Bruce Alvarez, DOB October 23, 1970, MRN [id number] PCP:  Dorothey Baseman, MD  Cardiologist:  [place]   Chief Complaint: f/u hospital for CHF  History of Present Illness: Bruce Alvarez is a 44 y.o. male with history of HTN, morbid obesity with suspected sleep apnea, HLD, DM, alcohol abuse, hypoaldosteronism, depression, anxiety who presents for post-hospital f/u. He was recently admitted for abdominal pain. He was found to have cholecystitis and elevated LFTs and underwent ERCP with plans for eventual cholecystectomy. Initial troponins were marginally elevated with very high BP thus cardiology saw the patient who found him to have combined systolic and diastolic CHF, possibly chronic. EF 25-30%, grade 1 DD, mod-severe LVH, moderate-severely reduced RV function as well. Cath 8/8 with normal cors, severe pulm HTN, elevated LVEDP. Etiology may be due to hypertensive heart disease. HIV was negative. He was treated with blood pressure medicine titration as well as Lasix. The patient still had high BP on day of d/c but was adamant that he felt BP would improve once he was back in home environment. He also reported strong commitment to remaining abstinent from alcohol. Internal medicine advised the patient to discuss sleep study with primary care. He was cleared for gallbladder surgery from cardiac standpoint but internal medicine felt this could be deferred as outpatient to a later date. Hgb 14-16, BMET ok with d/c Cr 1.04 and d/c weight 288lb.  He comes in today feeling "fantastic." He says he feels the best he's felt in a while. He does have occasional dizziness after taking  amlodipine but moved this to the nighttime which has helped. No chest pain, dyspnea, LEE, orthopnea, palpitations or syncope. He is down 25 lbs since discharge. He was drinking quite a bit prior to admission so he believes cutting alcohol out has contributed significantly to this loss. He has  committed himself to remaining abstinent from alcohol and eating healthy. He says IM has set up a sleep study for him in September.   Past Medical History  Diagnosis Date  . Hypertension   . Low testosterone   . Sleep apnea     "not technically dx'd by a physician" (05/11/2015)  . Depression     "took effexor til ~ 02/2015" (05/11/2015)  . Morbid obesity   . Hyperlipidemia   . Diabetes mellitus   . Alcohol abuse   . Hypoaldosteronism   . Anxiety   . Cholecystitis   . Chronic combined systolic and diastolic CHF (congestive heart failure)     a. Dx 05/2015 - NICM. EF 25-30%, grade 1 DD, mod-severe LVH, moderate-severely reduced RV function as well. Cath 8/8 with normal cors, severe pulm HTN, elevated LVEDP.  ? Hypertensive heart disease vs alcoholic cardiomyopathy.  . Pulmonary hypertension     a. By cath 05/2015.    Past Surgical History  Procedure Laterality Date  . Ercp N/A 05/12/2015    Procedure: ENDOSCOPIC RETROGRADE CHOLANGIOPANCREATOGRAPHY (ERCP);  Surgeon: Dorena Cookey, MD;  Location: [facility] ENDOSCOPY;  Service: Endoscopy;  Laterality: N/A;  . Cardiac catheterization N/A 05/16/2015    Procedure: Right/Left Heart Cath and Coronary Angiography;  Surgeon: Kathleene Hazel, MD;  Location: [facility] INVASIVE CV LAB;  Service: Cardiovascular;  Laterality: N/A;    Current Outpatient Prescriptions  Medication Sig Dispense Refill  . amLODipine (NORVASC) 5 MG tablet Take 1 tablet (5 mg total) by mouth daily. 30 tablet 0  . carvedilol (COREG) 25 MG tablet Take 1 tablet (25 mg total)  by mouth 2 (two) times daily with a meal. 60 tablet 0  . furosemide (LASIX) 40 MG tablet Take 1 tablet (40 mg total) by mouth 2 (two) times daily. 60 tablet 0  . losartan (COZAAR) 100 MG tablet Take 1 tablet (100 mg total) by mouth daily. 30 tablet 0  . Multiple Vitamin (MULTIVITAMIN WITH MINERALS) TABS tablet Take 1 tablet by mouth daily.    . potassium chloride SA (K-DUR,KLOR-CON) 20 MEQ tablet Take 1 tablet (20 mEq  total) by mouth 2 (two) times daily. 60 tablet 0   No current facility-administered medications for this visit.    Allergies:   Benzocaine and Other   Social History:  The patient  reports that he has been smoking Cigars.  He has never used smokeless tobacco. He reports that he drinks about 12.6 oz of alcohol per week. He reports that he does not use illicit drugs.   Family History:  The patient's family history includes Breast cancer in his mother; Heart attack (age of onset: 51) in his paternal grandfather; Heart attack (age of onset: 61) in his father; Hypertension in his father and mother. There is no history of Stroke.  ROS:  Please see the history of present illness.  All other systems are reviewed and otherwise negative.   PHYSICAL EXAM:  VS:  BP 119/80 mmHg  Pulse 80  Ht 5\' 4"  (1.626 m)  Wt 263 lb (119.296 kg)  BMI 45.12 kg/m2 BMI: Body mass index is 45.12 kg/(m^2). Well nourished, well developed obese WM in acute distress HEENT: normocephalic, atraumatic Neck: no JVD, carotid bruits or masses Cardiac:  normal S1, S2; RRR; no murmurs, rubs, or gallops Lungs:  clear to auscultation bilaterally, no wheezing, rhonchi or rales Abd: soft, nontender, no hepatomegaly, + BS MS: no deformity or atrophy Ext: no edema, right groin cath site without hematoma, ecchymosis or bruit Skin: warm and dry, no rash Neuro:  moves all extremities spontaneously, no focal abnormalities noted, follows commands Psych: euthymic mood, full affect  Recent Labs: 05/16/2015: ALT 357*; TSH 1.161 05/17/2015: BUN 11; Creatinine, Ser 1.04; Hemoglobin 16.5; Platelets 220; Potassium 3.7; Sodium 134*  05/13/2015: Cholesterol 195; HDL 50; LDL Cholesterol 120*; Total CHOL/HDL Ratio 3.9; Triglycerides 124; VLDL 25   Estimated Creatinine Clearance: 105.6 mL/min (by C-G formula based on Cr of 1.04).   Wt Readings from Last 3 Encounters:  05/25/15 263 lb (119.296 kg)  05/17/15 288 lb 2.3 oz (130.7 kg)     Other  studies reviewed: Additional studies/records reviewed today include: summarized above  ASSESSMENT AND PLAN:  1. Chronic combined CHF/NICM - Doing great on present regimen. Although amlodipine is less ideal given LV dysfunction, Dr. Tresa Endo said he  2. specifically chose this for BP management as well as less hepatic involvement than some of the other medicines at our disposal (given recent transaminitis). Salt/fluid restriction reinforced. Recheck CMET today. Will have the patient f/u in 2-3 months. At that time would consider repeat echocardiogram. If EF still low, will need referral to EP for consideration of ICD. 3. Alcohol abuse - remains abstinent.  4. Essential HTN - controlled today. I told him that as he continues to lose weight he may require less medication if his BP comes down further. He will follow at home and will call if it begins to run on the low side. 5. Morbid obesity - he seems very committed to losing weight. He says he has realized this is contributing heavily to majority of his health problems.  He says IM has set up a sleep study for him in September.  Disposition: F/u with Dr. [place] 2-3 months.  Current medicines are reviewed at length with the patient today.  The patient did not have any concerns regarding medicines.  Thomasene Mohair PA-C 05/25/2015 9:03 AM     [facility] [address] [place] [address] [phone] (office)  [phone] (fax)

## 2015-05-25 ENCOUNTER — Ambulatory Visit (INDEPENDENT_AMBULATORY_CARE_PROVIDER_SITE_OTHER): Payer: Self-pay | Admitting: Physician Assistant

## 2015-05-25 ENCOUNTER — Encounter: Payer: Self-pay | Admitting: Physician Assistant

## 2015-05-25 VITALS — BP 119/80 | HR 80 | Ht 64.0 in | Wt 263.0 lb

## 2015-05-25 DIAGNOSIS — I10 Essential (primary) hypertension: Secondary | ICD-10-CM

## 2015-05-25 DIAGNOSIS — I428 Other cardiomyopathies: Secondary | ICD-10-CM

## 2015-05-25 DIAGNOSIS — I429 Cardiomyopathy, unspecified: Secondary | ICD-10-CM

## 2015-05-25 DIAGNOSIS — I5042 Chronic combined systolic (congestive) and diastolic (congestive) heart failure: Secondary | ICD-10-CM

## 2015-05-25 DIAGNOSIS — F1010 Alcohol abuse, uncomplicated: Secondary | ICD-10-CM

## 2015-05-25 NOTE — Patient Instructions (Addendum)
Medication Instructions:  -No change  Labwork: Cmet -Sent to [facility] with written prescription due to computer difficulties.( Epic was down).  Testing/Procedures: -None  Follow-Up: Your physician recommends that you keep your scheduled  follow-up appointment on November 23 with Dr. Thomasene Lot at the [facility] location.   Any Other Special Instructions Will Be Listed Below (If Applicable).  This will be mailed to patient due to system being down

## 2015-05-25 NOTE — Addendum Note (Signed)
Addended by: Debbe Bales on: 05/25/2015 09:29 AM   Modules accepted: Orders

## 2015-06-15 ENCOUNTER — Other Ambulatory Visit: Payer: Self-pay | Admitting: Internal Medicine

## 2015-06-20 ENCOUNTER — Other Ambulatory Visit: Payer: Self-pay

## 2015-06-20 MED ORDER — CARVEDILOL 25 MG PO TABS: 25.0000 mg | ORAL_TABLET | Freq: Two times a day (BID) | ORAL | Status: DC

## 2015-06-20 MED ORDER — LOSARTAN POTASSIUM 100 MG PO TABS: 100.0000 mg | ORAL_TABLET | Freq: Every day | ORAL | Status: DC

## 2015-06-20 MED ORDER — FUROSEMIDE 40 MG PO TABS: 40.0000 mg | ORAL_TABLET | Freq: Two times a day (BID) | ORAL | Status: DC

## 2015-06-20 MED ORDER — AMLODIPINE BESYLATE 5 MG PO TABS: 5.0000 mg | ORAL_TABLET | Freq: Every day | ORAL | Status: DC

## 2015-08-31 ENCOUNTER — Ambulatory Visit: Payer: Self-pay | Admitting: Cardiology

## 2015-10-07 IMAGING — US US ABDOMEN LIMITED
1 series · 14 of 25 positions shown · non-contrast
Comparison: MRCP 04/02/10

CLINICAL DATA: Epigastric pain

EXAM:
US ABDOMEN LIMITED - RIGHT UPPER QUADRANT

[Series 1: us abdomen limited · 0.25mm/px · 14 of 45 slices shown]
[im 1/45]
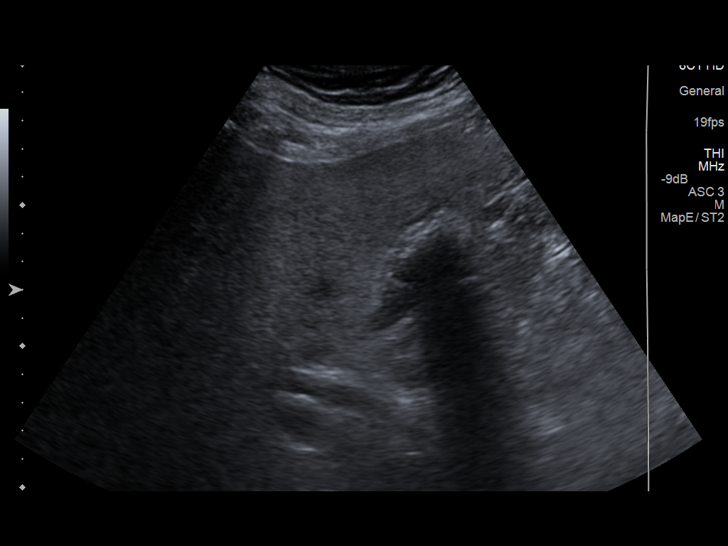
[im 4/45]
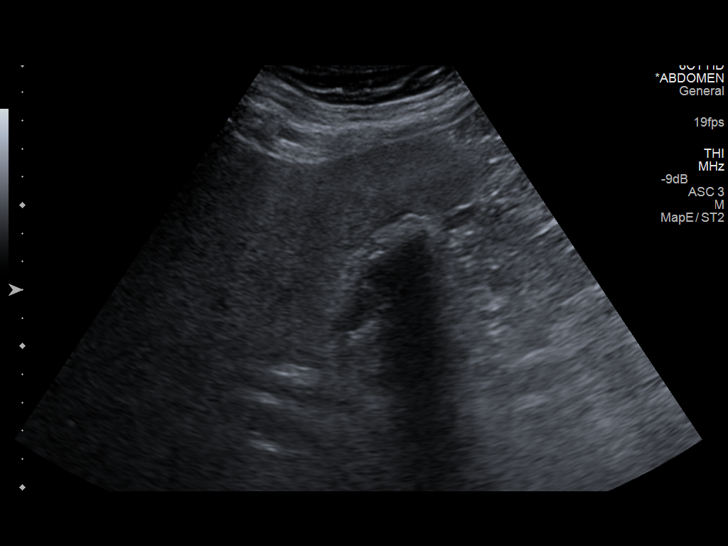
[im 8/45]
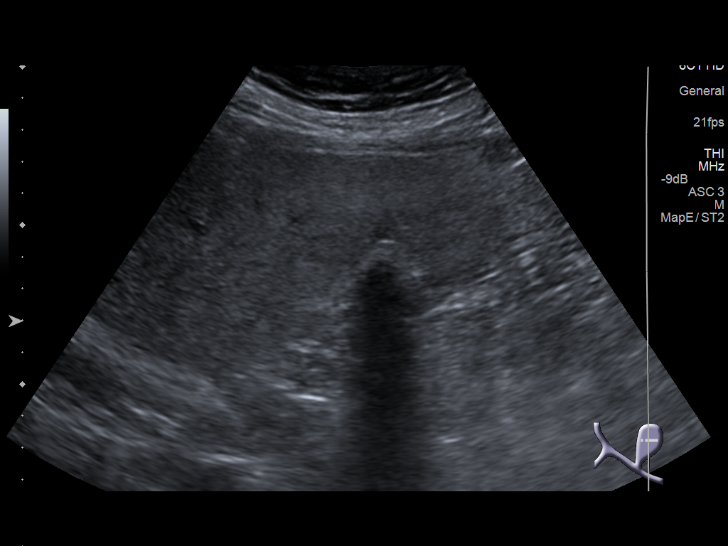
[im 12/45]
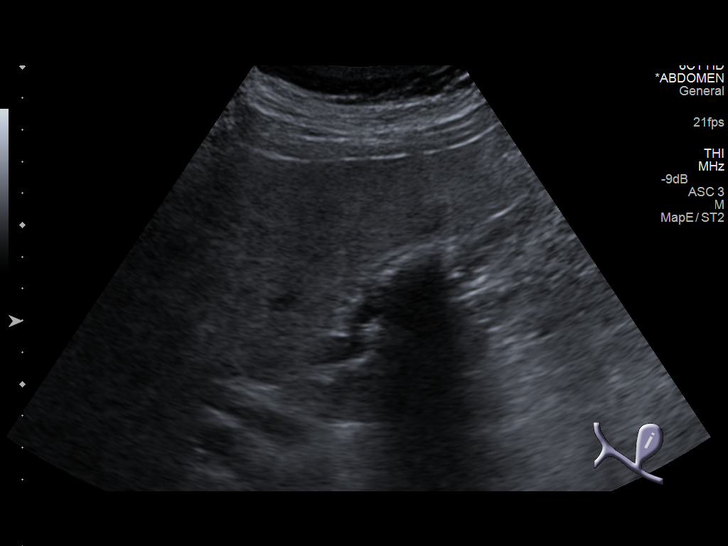
[im 15/45]
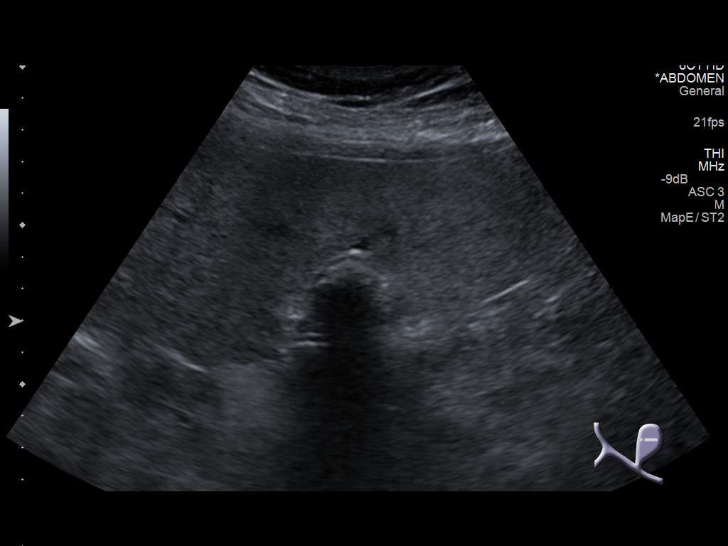
[im 17/45]
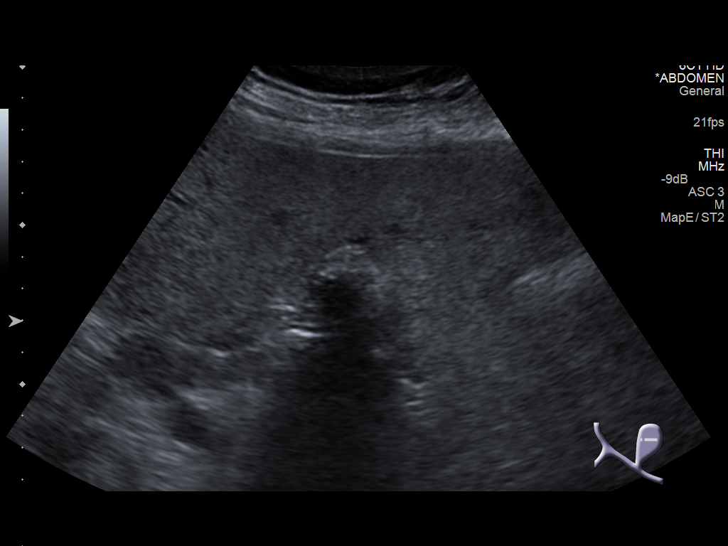
[im 21/45]
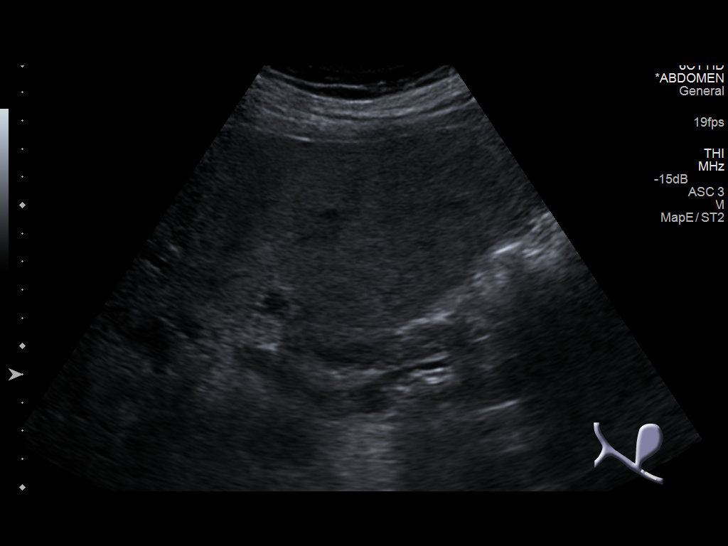
[im 24/45]
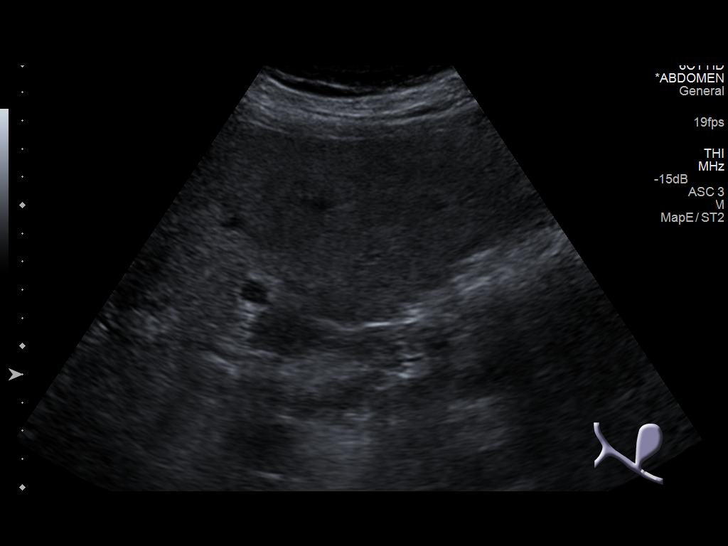
[im 28/45]
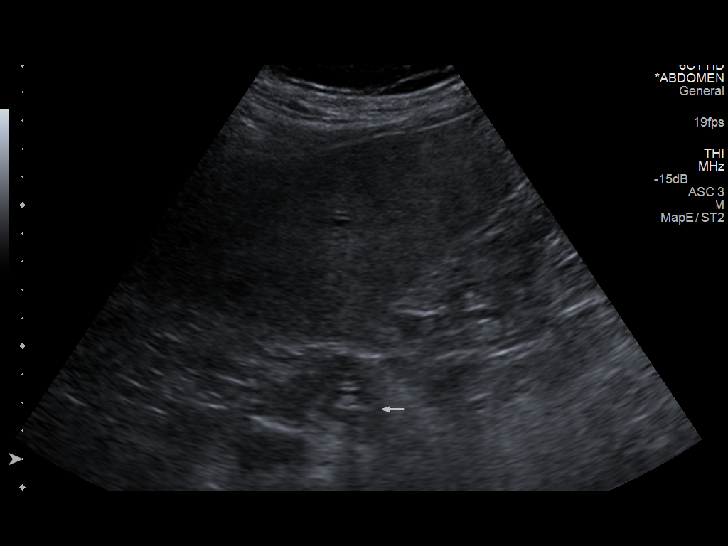
[im 30/45]
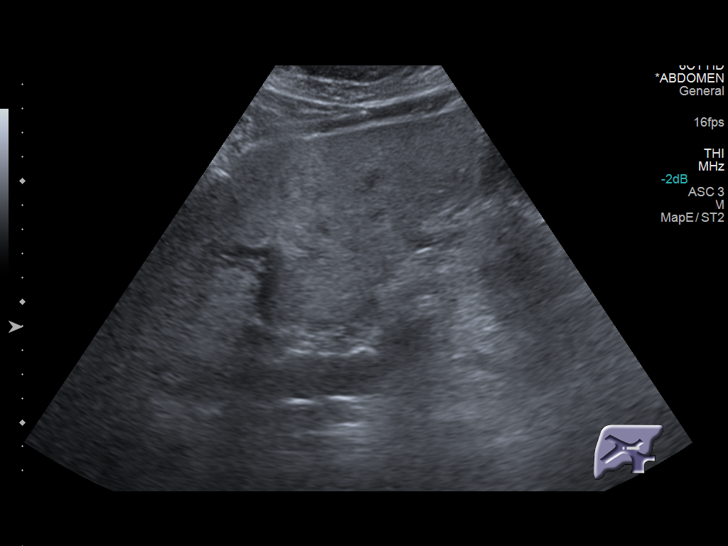
[im 34/45]
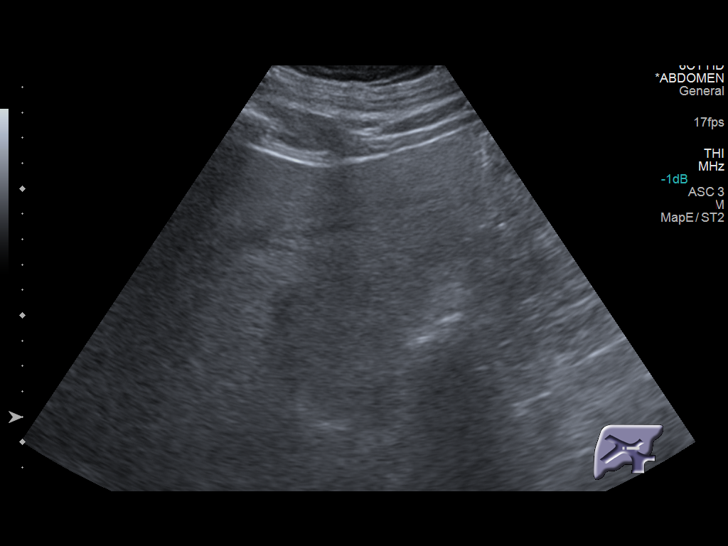
[im 37/45]
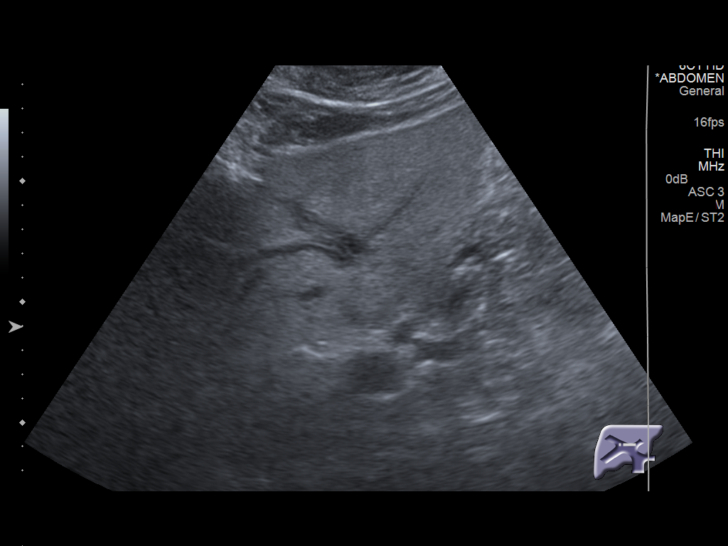
[im 41/45]
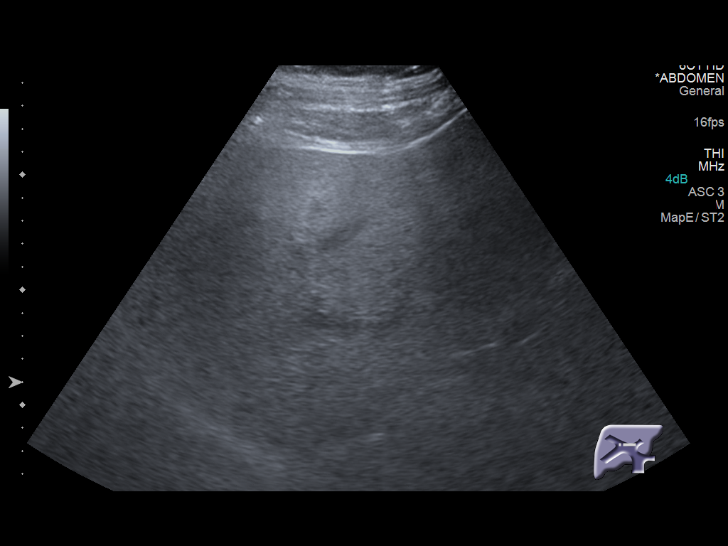
[im 45/45]
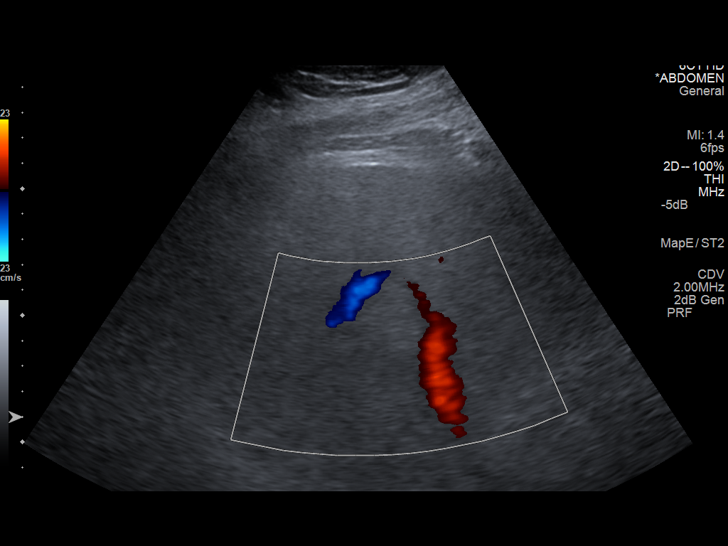

[14 of 25 positions shown; findings below may reference images not displayed]

FINDINGS: Gallbladder:

Contracted gallbladder with multiple gallstones. There is mild
thickening of gallbladder wall up to 3.2 mm. No sonographic Murphy's
sign.

Common bile duct:

Diameter: There is distended CBD up to 10.8 mm. Probable stone
within CBD measures at least 1.2 cm. Further correlation with ERCP
or MRCP could be performed.

Liver:

There is diffuse increased echogenicity of the liver. Mild
intrahepatic biliary ductal dilatation. No focal hepatic mass.
IMPRESSION: Contracted gallbladder. Multiple gallstones are noted within
gallbladder. There is CBD dilatation up to 10.8 mm. Probable stone
within CBD measures at least 1.2 cm. Diffuse increased echogenicity
of the liver consistent with fatty infiltration. Mild intrahepatic
biliary ductal dilatation.

These results were called by telephone at the time of interpretation
on 05/11/2015 at [DATE] to Dr. ESTEPHANIE BOAMAH , who verbally
acknowledged these results.

## 2016-03-02 IMAGING — CR DG CHEST 2V
2 series · 2 of 2 positions shown · non-contrast
Comparison: Prior chest x-ray 10/29/2013 (report only)

CLINICAL DATA: 44-year-old male with CHF and cardiomyopathy

EXAM:
CHEST  2 VIEW

[chest lat]
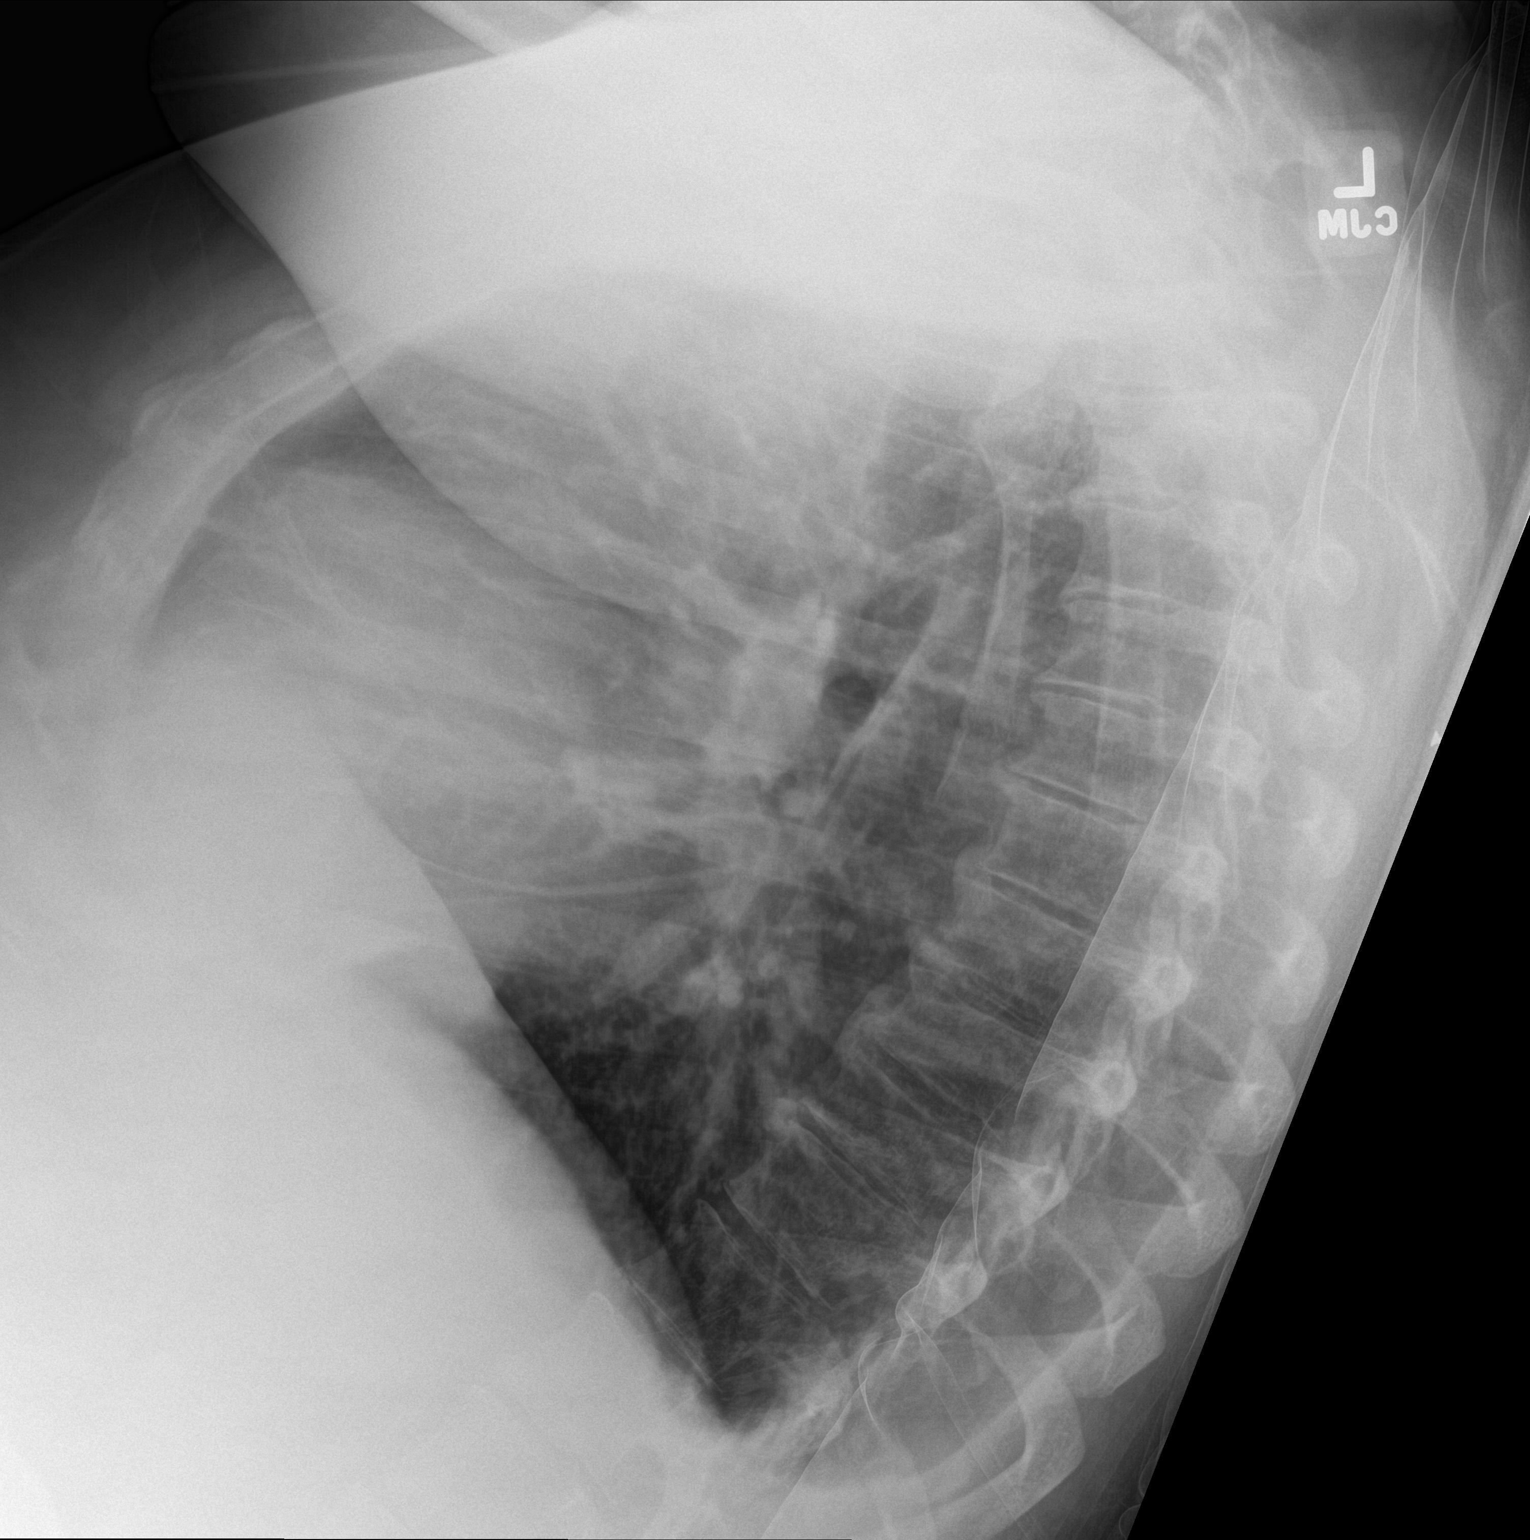

[chest ap]
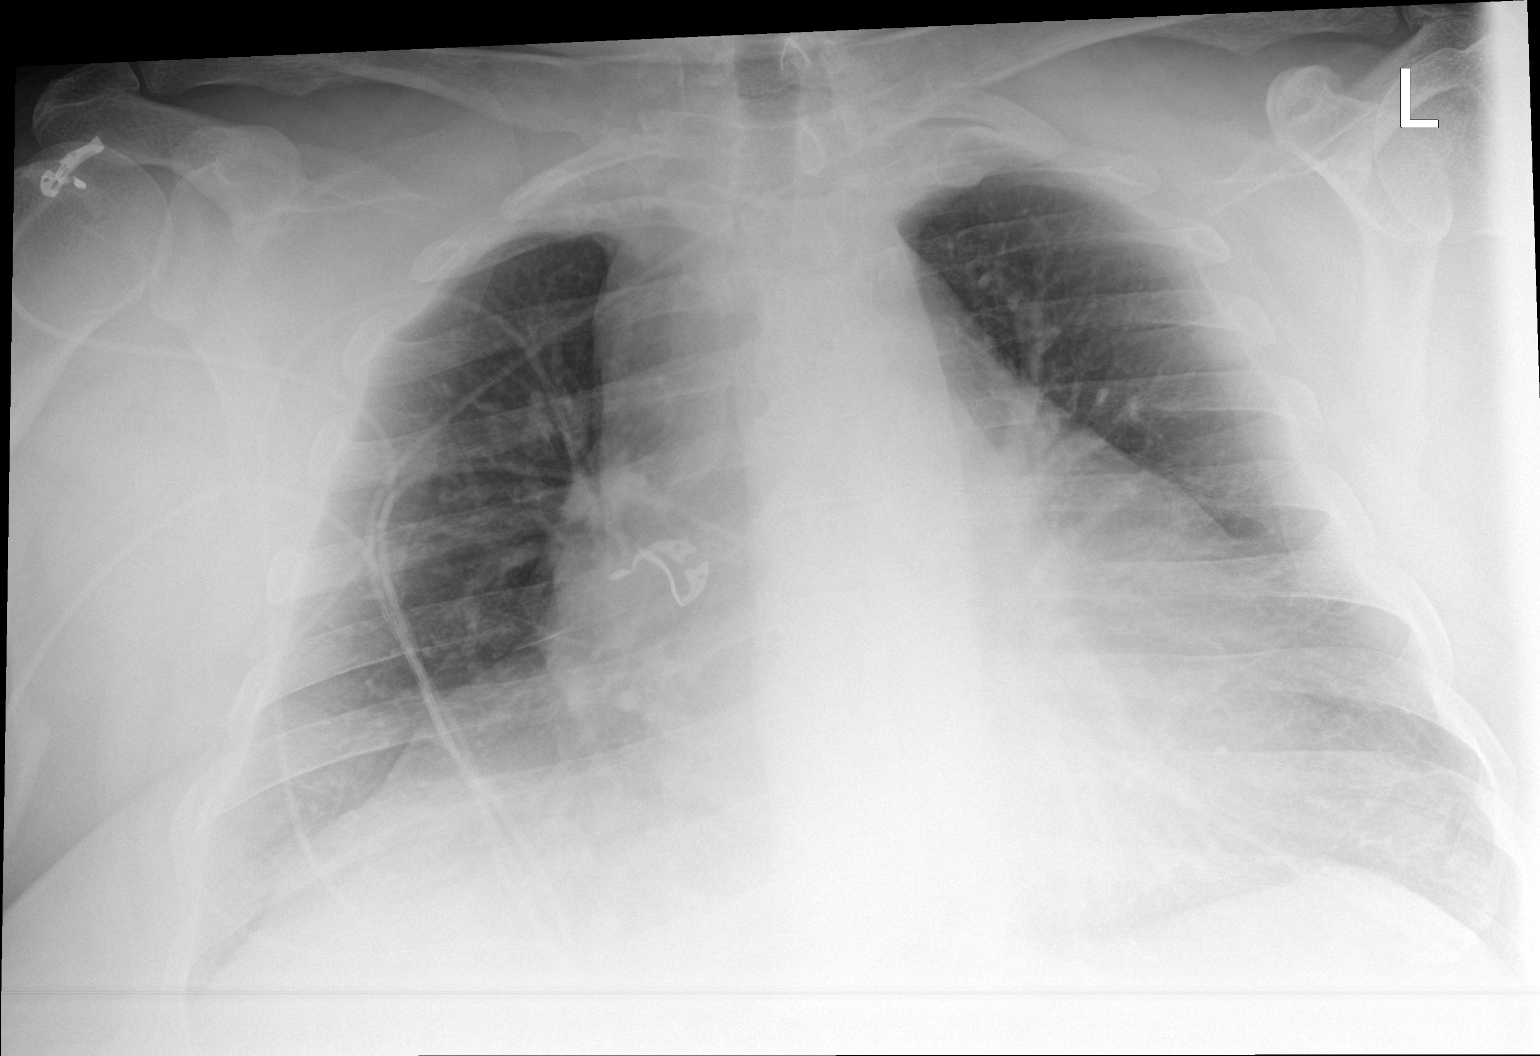

[2 of 2 positions shown; findings below may reference images not displayed]

FINDINGS: Cardiomegaly. Mild pulmonary vascular congestion without overt
edema. Trace bilateral pleural effusions. No focal airspace
consolidation. Osseous structures are intact and unremarkable.
IMPRESSION: 1. Cardiomegaly and mild pulmonary vascular congestion without overt
pulmonary edema or evidence of failure.
2. Trace bilateral pleural effusions.

## 2019-11-10 ENCOUNTER — Ambulatory Visit: Payer: 59 | Attending: Internal Medicine

## 2019-11-10 DIAGNOSIS — Z20822 Contact with and (suspected) exposure to covid-19: Secondary | ICD-10-CM | POA: Insufficient documentation

## 2019-11-11 LAB — NOVEL CORONAVIRUS, NAA: SARS-CoV-2, NAA: NOT DETECTED

## 2019-12-31 ENCOUNTER — Encounter: Payer: Self-pay | Admitting: Family Medicine

## 2019-12-31 MED ORDER — CARVEDILOL 25 MG PO TABS: 25.0000 mg | ORAL_TABLET | Freq: Two times a day (BID) | ORAL | 0 refills | Status: DC

## 2019-12-31 NOTE — Addendum Note (Signed)
Addended by: Consuella Lose on: 12/31/2019 03:03 PM   Modules accepted: Orders

## 2019-12-31 NOTE — Telephone Encounter (Signed)
Ok to work patient in sooner for acute issues prior to NP visit? Also ok to refill medication since he is out?

## 2019-12-31 NOTE — Telephone Encounter (Signed)
Spoke with patient and refilled medication and scheduled sooner appointment.

## 2020-01-05 ENCOUNTER — Encounter: Payer: Self-pay | Admitting: Family Medicine

## 2020-01-05 ENCOUNTER — Other Ambulatory Visit: Payer: Self-pay

## 2020-01-05 ENCOUNTER — Ambulatory Visit: Payer: 59 | Admitting: Family Medicine

## 2020-01-05 VITALS — BP 124/78 | HR 71 | Temp 98.3°F | Ht 62.5 in | Wt 221.8 lb

## 2020-01-05 DIAGNOSIS — R1013 Epigastric pain: Secondary | ICD-10-CM

## 2020-01-05 DIAGNOSIS — E6609 Other obesity due to excess calories: Secondary | ICD-10-CM

## 2020-01-05 DIAGNOSIS — I10 Essential (primary) hypertension: Secondary | ICD-10-CM | POA: Diagnosis not present

## 2020-01-05 DIAGNOSIS — Z6839 Body mass index (BMI) 39.0-39.9, adult: Secondary | ICD-10-CM

## 2020-01-05 DIAGNOSIS — E119 Type 2 diabetes mellitus without complications: Secondary | ICD-10-CM

## 2020-01-05 DIAGNOSIS — E1169 Type 2 diabetes mellitus with other specified complication: Secondary | ICD-10-CM | POA: Diagnosis not present

## 2020-01-05 DIAGNOSIS — I5042 Chronic combined systolic (congestive) and diastolic (congestive) heart failure: Secondary | ICD-10-CM | POA: Diagnosis not present

## 2020-01-05 DIAGNOSIS — E785 Hyperlipidemia, unspecified: Secondary | ICD-10-CM

## 2020-01-05 LAB — CBC WITH DIFFERENTIAL/PLATELET
Basophils Absolute: 0 10*3/uL (ref 0.0–0.1)
Basophils Relative: 0.6 % (ref 0.0–3.0)
Eosinophils Absolute: 0.1 10*3/uL (ref 0.0–0.7)
Eosinophils Relative: 1.8 % (ref 0.0–5.0)
HCT: 40.8 % (ref 39.0–52.0)
Hemoglobin: 13.9 g/dL (ref 13.0–17.0)
Lymphocytes Relative: 31.8 % (ref 12.0–46.0)
Lymphs Abs: 1.7 10*3/uL (ref 0.7–4.0)
MCHC: 34.1 g/dL (ref 30.0–36.0)
MCV: 92.5 fl (ref 78.0–100.0)
Monocytes Absolute: 0.5 10*3/uL (ref 0.1–1.0)
Monocytes Relative: 9.3 % (ref 3.0–12.0)
Neutro Abs: 3.1 10*3/uL (ref 1.4–7.7)
Neutrophils Relative %: 56.5 % (ref 43.0–77.0)
Platelets: 217 10*3/uL (ref 150.0–400.0)
RBC: 4.42 Mil/uL (ref 4.22–5.81)
RDW: 12.7 % (ref 11.5–15.5)
WBC: 5.4 10*3/uL (ref 4.0–10.5)

## 2020-01-05 LAB — LIPID PANEL
Cholesterol: 200 mg/dL (ref 0–200)
HDL: 36.9 mg/dL — ABNORMAL LOW
LDL Cholesterol: 146 mg/dL — ABNORMAL HIGH (ref 0–99)
NonHDL: 163.27
Total CHOL/HDL Ratio: 5
Triglycerides: 87 mg/dL (ref 0.0–149.0)
VLDL: 17.4 mg/dL (ref 0.0–40.0)

## 2020-01-05 LAB — COMPREHENSIVE METABOLIC PANEL WITH GFR
ALT: 22 U/L (ref 0–53)
AST: 21 U/L (ref 0–37)
Albumin: 4.2 g/dL (ref 3.5–5.2)
Alkaline Phosphatase: 51 U/L (ref 39–117)
BUN: 21 mg/dL (ref 6–23)
CO2: 31 meq/L (ref 19–32)
Calcium: 9.2 mg/dL (ref 8.4–10.5)
Chloride: 102 meq/L (ref 96–112)
Creatinine, Ser: 0.92 mg/dL (ref 0.40–1.50)
GFR: 87.02 mL/min
Glucose, Bld: 88 mg/dL (ref 70–99)
Potassium: 4.4 meq/L (ref 3.5–5.1)
Sodium: 135 meq/L (ref 135–145)
Total Bilirubin: 0.7 mg/dL (ref 0.2–1.2)
Total Protein: 6.8 g/dL (ref 6.0–8.3)

## 2020-01-05 LAB — HEMOGLOBIN A1C: Hgb A1c MFr Bld: 5.4 % (ref 4.6–6.5)

## 2020-01-05 MED ORDER — OMEPRAZOLE 20 MG PO CPDR: 20.0000 mg | DELAYED_RELEASE_CAPSULE | Freq: Every day | ORAL | 3 refills | Status: DC

## 2020-01-05 NOTE — Assessment & Plan Note (Signed)
Diagnosed in 2016. Reports weight loss and feeling great. Denies DOE or symptoms today. Will repeat echo. Discussed protective function of BP medication.

## 2020-01-05 NOTE — Progress Notes (Signed)
Subjective:     Bruce Alvarez is a 49 y.o. male presenting for Abdominal Pain (discomfort below sternum area down to the belly button area. started around 05/2019 or 06/2019. )     Abdominal Pain This is a new problem. The current episode started more than 1 month ago. The onset quality is gradual. The problem occurs daily. The problem has been unchanged. The pain is located in the epigastric region and RUQ. The pain is mild. The quality of the pain is burning and cramping. The abdominal pain radiates to the LLQ. Associated symptoms include diarrhea (occasional loose stool) and weight loss (planned). Pertinent negatives include no anorexia, belching, constipation, dysuria, fever, flatus, headaches, hematochezia, nausea or vomiting. Associated symptoms comments: No reflux. Nothing aggravates the pain. The pain is relieved by nothing. He has tried nothing for the symptoms. The treatment provided no relief.   Has been doing intermittent fasting Low carb  Last time he had blood work done was 2016  #CHF - has lost 85 lbs this year - Is working to continue to lose weight - has been noticing at around 2 hours after taking medication will get lightheaded in the evening - BP has been well controlled - Last ECHO was 2016   Review of Systems  Constitutional: Positive for weight loss (planned). Negative for fever.  Gastrointestinal: Positive for abdominal pain and diarrhea (occasional loose stool). Negative for anorexia, constipation, flatus, hematochezia, nausea and vomiting.  Genitourinary: Negative for dysuria.  Neurological: Negative for headaches.     Social History   Tobacco Use  Smoking Status Current Some Day Smoker  . Years: 10.00  . Types: Cigars  Smokeless Tobacco Never Used  Tobacco Comment   occasional smoker cigar, but never cigarette        Objective:    BP Readings from Last 3 Encounters:  01/05/20 124/78  05/25/15 119/80  05/17/15 (!) 173/92   Wt Readings from  Last 3 Encounters:  01/05/20 221 lb 12 oz (100.6 kg)  05/25/15 263 lb (119.3 kg)  05/17/15 288 lb 2.3 oz (130.7 kg)    BP 124/78   Pulse 71   Temp 98.3 F (36.8 C)   Ht 5' 2.5" (1.588 m)   Wt 221 lb 12 oz (100.6 kg)   SpO2 98%   BMI 39.91 kg/m    Physical Exam Constitutional:      Appearance: Normal appearance. He is not ill-appearing or diaphoretic.  HENT:     Right Ear: External ear normal.     Left Ear: External ear normal.     Nose: Nose normal.  Eyes:     General: No scleral icterus.    Extraocular Movements: Extraocular movements intact.     Conjunctiva/sclera: Conjunctivae normal.  Cardiovascular:     Rate and Rhythm: Normal rate and regular rhythm.     Heart sounds: No murmur.  Pulmonary:     Effort: Pulmonary effort is normal. No respiratory distress.     Breath sounds: Normal breath sounds. No wheezing.  Abdominal:     General: Abdomen is flat. Bowel sounds are normal.     Palpations: Abdomen is soft.     Tenderness: There is no abdominal tenderness. There is no guarding or rebound. Negative signs include Murphy's sign.  Musculoskeletal:     Cervical back: Neck supple.  Skin:    General: Skin is warm and dry.  Neurological:     Mental Status: He is alert. Mental status is at baseline.  Psychiatric:  Mood and Affect: Mood normal.     ECHO 05/2015: EF 25-30%      Assessment & Plan:   Problem List Items Addressed This Visit      Cardiovascular and Mediastinum   Essential hypertension - Primary    Trial of 50 mg of losartan to see if he gets less lightheadedness. Continue other medications pending labs and ECHO given hx.       Relevant Medications   sildenafil (REVATIO) 20 MG tablet   Other Relevant Orders   Comprehensive metabolic panel   Chronic combined systolic and diastolic CHF (congestive heart failure) ([place])    Diagnosed in 2016. Reports weight loss and feeling great. Denies DOE or symptoms today. Will repeat echo. Discussed  protective function of BP medication.      Relevant Medications   sildenafil (REVATIO) 20 MG tablet   Other Relevant Orders   ECHOCARDIOGRAM COMPLETE     Endocrine   Diabetes mellitus (HCC)    Last Hgb A1c 6.8%. Reports not checked in several years. Hopeful with low carb diet and weight loss this will be stable, but not currently on treatment.       Relevant Orders   Hemoglobin A1c     Other   Obesity    Pt has made significant improvement with weight loss. Will continue to monitor blood work today.       Epigastric pain    Hx of gall stone disease, but symptoms seem atypical of this. Will get blood work to evaluate for liver changes and get imaging if necessary. Trial of PPI and return in 2 weeks to check in.       Relevant Medications   omeprazole (PRILOSEC) 20 MG capsule   Other Relevant Orders   Comprehensive metabolic panel   CBC with Differential    Other Visit Diagnoses    Hyperlipidemia associated with type 2 diabetes mellitus ([place])       Relevant Orders   Lipid panel       Return in about 2 weeks (around 01/19/2020) for keep appointment.  Lesleigh Noe, MD

## 2020-01-05 NOTE — Assessment & Plan Note (Signed)
Last Hgb A1c 6.8%. Reports not checked in several years. Hopeful with low carb diet and weight loss this will be stable, but not currently on treatment.

## 2020-01-05 NOTE — Patient Instructions (Signed)
Abdominal pain - labs today - if normal - omeprazole trial daily for 2 weeks   Heart failure - echo ordered - labs today  Blood pressure - can try breaking losartan in half for the next 2 weeks and monitor your blood pressure during that time

## 2020-01-05 NOTE — Assessment & Plan Note (Signed)
Hx of gall stone disease, but symptoms seem atypical of this. Will get blood work to evaluate for liver changes and get imaging if necessary. Trial of PPI and return in 2 weeks to check in.

## 2020-01-05 NOTE — Assessment & Plan Note (Signed)
Trial of 50 mg of losartan to see if he gets less lightheadedness. Continue other medications pending labs and ECHO given hx.

## 2020-01-05 NOTE — Assessment & Plan Note (Signed)
Pt has made significant improvement with weight loss. Will continue to monitor blood work today.

## 2020-01-19 ENCOUNTER — Encounter: Payer: Self-pay | Admitting: Family Medicine

## 2020-01-19 ENCOUNTER — Ambulatory Visit: Payer: 59 | Admitting: Family Medicine

## 2020-01-19 ENCOUNTER — Other Ambulatory Visit: Payer: Self-pay

## 2020-01-19 VITALS — BP 130/88 | HR 70 | Temp 97.2°F | Ht 62.5 in | Wt 222.0 lb

## 2020-01-19 DIAGNOSIS — I5042 Chronic combined systolic (congestive) and diastolic (congestive) heart failure: Secondary | ICD-10-CM

## 2020-01-19 DIAGNOSIS — E785 Hyperlipidemia, unspecified: Secondary | ICD-10-CM

## 2020-01-19 DIAGNOSIS — E1169 Type 2 diabetes mellitus with other specified complication: Secondary | ICD-10-CM

## 2020-01-19 DIAGNOSIS — F17290 Nicotine dependence, other tobacco product, uncomplicated: Secondary | ICD-10-CM

## 2020-01-19 DIAGNOSIS — I10 Essential (primary) hypertension: Secondary | ICD-10-CM

## 2020-01-19 DIAGNOSIS — K130 Diseases of lips: Secondary | ICD-10-CM

## 2020-01-19 DIAGNOSIS — E119 Type 2 diabetes mellitus without complications: Secondary | ICD-10-CM

## 2020-01-19 DIAGNOSIS — R1013 Epigastric pain: Secondary | ICD-10-CM

## 2020-01-19 MED ORDER — FUROSEMIDE 40 MG PO TABS: 40.0000 mg | ORAL_TABLET | Freq: Two times a day (BID) | ORAL | 2 refills | Status: DC

## 2020-01-19 MED ORDER — AMLODIPINE BESYLATE 5 MG PO TABS: 5.0000 mg | ORAL_TABLET | Freq: Every day | ORAL | 2 refills | Status: DC

## 2020-01-19 MED ORDER — ATORVASTATIN CALCIUM 10 MG PO TABS: 10.0000 mg | ORAL_TABLET | Freq: Every day | ORAL | 3 refills | Status: DC

## 2020-01-19 MED ORDER — LOSARTAN POTASSIUM 100 MG PO TABS: 100.0000 mg | ORAL_TABLET | Freq: Every day | ORAL | 2 refills | Status: DC

## 2020-01-19 NOTE — Assessment & Plan Note (Signed)
DM in remission after weight loss and diet changes. Continue diet changes, exercise. Will continue to monitor.

## 2020-01-19 NOTE — Progress Notes (Signed)
Subjective:     Bruce Alvarez is a 49 y.o. male presenting for Establish Care (previous PCP Dr Phillis Haggis has been sseen for acute ), Hyperlipidemia (discuss medication), and Abdominal Pain (follow up.)     HPI  #HLD - is interested in medication  #Abdominal pain - cramping and epigastric - taking the omeprazole - for 2 weeks - did have some mild change in symptoms - in the past has had days that were symptom free prior to starting medicine but unclear if more days w/o symptoms - did have symptoms last night - no change with BM - not reflux or gas related pain  #lip lesion - noticed 6 months ago - feels like a hard blood blister - no change in size - has not gone away - has not tried to treat it - has not been to the dentist  In general everything is worrying him Anxiety about mortality and covid    Review of Systems  01/05/2020: Clinic - HTN/CHF - decrease losartan due to lightheadedness, repeat ECHO. DM - remission. Epigastric pain - PPI trial (hx of gallstones - labs normal. Start statin  Social History   Tobacco Use  Smoking Status Current Some Day Smoker  . Packs/day: 0.00  . Years: 15.00  . Pack years: 0.00  . Types: Cigars  Smokeless Tobacco Never Used  Tobacco Comment   Never smoked cigarettes. Only occasional cigar.        Objective:    BP Readings from Last 3 Encounters:  01/19/20 130/88  01/05/20 124/78  05/25/15 119/80   Wt Readings from Last 3 Encounters:  01/19/20 222 lb (100.7 kg)  01/05/20 221 lb 12 oz (100.6 kg)  05/25/15 263 lb (119.3 kg)    BP 130/88   Pulse 70   Temp (!) 97.2 F (36.2 C)   Ht 5' 2.5" (1.588 m)   Wt 222 lb (100.7 kg)   SpO2 99%   BMI 39.96 kg/m    Physical Exam Constitutional:      Appearance: Normal appearance. He is not ill-appearing or diaphoretic.  HENT:     Right Ear: External ear normal.     Left Ear: External ear normal.     Nose: Nose normal.     Mouth/Throat:     Lips: Lesions  (raised papule on the right upper lip) present.  Eyes:     General: No scleral icterus.    Extraocular Movements: Extraocular movements intact.     Conjunctiva/sclera: Conjunctivae normal.  Cardiovascular:     Rate and Rhythm: Normal rate and regular rhythm.     Heart sounds: No murmur.  Pulmonary:     Effort: Pulmonary effort is normal. No respiratory distress.     Breath sounds: Normal breath sounds.  Musculoskeletal:     Cervical back: Neck supple.  Skin:    General: Skin is warm and dry.  Neurological:     Mental Status: He is alert. Mental status is at baseline.  Psychiatric:        Mood and Affect: Mood normal.      The 10-year ASCVD risk score Denman George DC Jr., et al., 2013) is: 22.6%   Values used to calculate the score:     Age: 33 years     Sex: Male     Is Non-Hispanic African American: No     Diabetic: Yes     Tobacco smoker: Yes     Systolic Blood Pressure: 130 mmHg     Is  BP treated: Yes     HDL Cholesterol: 36.9 mg/dL     Total Cholesterol: 200 mg/dL      Assessment & Plan:   Problem List Items Addressed This Visit      Cardiovascular and Mediastinum   Essential hypertension    BP at goal. Pt did not decrease losartan, due to lightheadedness discussed he could decrease to 50 mg (1/2 tablet) and monitor bp at home. Continue other medications      Relevant Medications   atorvastatin (LIPITOR) 10 MG tablet   losartan (COZAAR) 100 MG tablet   amLODipine (NORVASC) 5 MG tablet   furosemide (LASIX) 40 MG tablet   Chronic combined systolic and diastolic CHF (congestive heart failure) (HCC)    Continue medication. ECHO ordered.       Relevant Medications   atorvastatin (LIPITOR) 10 MG tablet   losartan (COZAAR) 100 MG tablet   amLODipine (NORVASC) 5 MG tablet   furosemide (LASIX) 40 MG tablet     Endocrine   Type 2 diabetes mellitus in remission ([place])    DM in remission after weight loss and diet changes. Continue diet changes, exercise. Will continue  to monitor.       Relevant Medications   atorvastatin (LIPITOR) 10 MG tablet   losartan (COZAAR) 100 MG tablet   Hyperlipidemia associated with type 2 diabetes mellitus ([place]) - Primary    Start atorvastatin to reduce risk of MI/Stroke      Relevant Medications   atorvastatin (LIPITOR) 10 MG tablet   losartan (COZAAR) 100 MG tablet     Other   Epigastric pain    Minimal improvement with omeprazole. Discussed imaging now vs referral and given that his symptoms do not seem consistent with gallstone will do referral and defer further work-up to GI.       Relevant Orders   Ambulatory referral to Gastroenterology   Lip lesion    Not painful present >6 months. Refer to ENT given cigar use for evaluation and treatment      Relevant Orders   Ambulatory referral to ENT   Cigar smoker   Relevant Orders   Ambulatory referral to ENT       Return in about 3 months (around 04/19/2020) for cholesterol check.  Lesleigh Noe, MD

## 2020-01-19 NOTE — Assessment & Plan Note (Signed)
Continue medication. ECHO ordered.

## 2020-01-19 NOTE — Assessment & Plan Note (Signed)
Minimal improvement with omeprazole. Discussed imaging now vs referral and given that his symptoms do not seem consistent with gallstone will do referral and defer further work-up to GI.

## 2020-01-19 NOTE — Assessment & Plan Note (Signed)
Not painful present >6 months. Refer to ENT given cigar use for evaluation and treatment

## 2020-01-19 NOTE — Assessment & Plan Note (Signed)
BP at goal. Pt did not decrease losartan, due to lightheadedness discussed he could decrease to 50 mg (1/2 tablet) and monitor bp at home. Continue other medications

## 2020-01-19 NOTE — Assessment & Plan Note (Signed)
Start atorvastatin to reduce risk of MI/Stroke

## 2020-01-19 NOTE — Patient Instructions (Addendum)
Great to see you again  #Referral I have placed a referral to a specialist for you. You should receive a phone call from the specialty office. Make sure your voicemail is not full and that if you are able to answer your phone to unknown or new numbers.   It may take up to 2 weeks to hear about the referral. If you do not hear anything in 2 weeks, please call our office and ask to speak with the referral coordinator.    #Lip lesion - ENT referral  #Abdominal pain - GI referral - continue omeprazole  Start atorvastatin

## 2020-02-02 ENCOUNTER — Other Ambulatory Visit: Payer: Self-pay

## 2020-02-02 ENCOUNTER — Encounter: Payer: Self-pay | Admitting: Nurse Practitioner

## 2020-02-02 ENCOUNTER — Ambulatory Visit (INDEPENDENT_AMBULATORY_CARE_PROVIDER_SITE_OTHER): Payer: 59

## 2020-02-02 DIAGNOSIS — I5042 Chronic combined systolic (congestive) and diastolic (congestive) heart failure: Secondary | ICD-10-CM | POA: Diagnosis not present

## 2020-02-11 ENCOUNTER — Ambulatory Visit: Payer: 59 | Admitting: Nurse Practitioner

## 2020-02-24 ENCOUNTER — Other Ambulatory Visit: Payer: Self-pay | Admitting: Family Medicine

## 2020-03-01 ENCOUNTER — Ambulatory Visit: Payer: 59 | Admitting: Nurse Practitioner

## 2020-05-03 ENCOUNTER — Encounter: Payer: Self-pay | Admitting: Family Medicine

## 2020-05-03 ENCOUNTER — Other Ambulatory Visit: Payer: Self-pay

## 2020-05-03 DIAGNOSIS — R1013 Epigastric pain: Secondary | ICD-10-CM

## 2020-05-03 MED ORDER — OMEPRAZOLE 20 MG PO CPDR: 20.0000 mg | DELAYED_RELEASE_CAPSULE | Freq: Every day | ORAL | 0 refills | Status: DC

## 2020-05-03 NOTE — Telephone Encounter (Signed)
Spoke to pt and told him about rx refill sent in. Then also relayed info from Dr. Selena Batten to pt and he says he was under the impression that if the omeprazole was working then he could just continue it instead of seeing GI. Pt states he does take it daily and his symptoms are better than they were in April when last seen here.  He will look into making a GI appt.

## 2020-05-03 NOTE — Telephone Encounter (Signed)
Pt requested refill. Last filled on 01/05/20 #30 with 3 refills.  Last OV: 01/19/20 for hyperlipidemia/DM  Was given referral to see Laurette Schimke and told to f/u in 3 months.  No visit to gastro in epic and no future appts scheduled.  Please advise.

## 2020-05-03 NOTE — Telephone Encounter (Signed)
If symptoms are improving, would recommend that he stop the medication and continue to avoid food triggers.   Can take medication as needed. If he feels he continues to need this medication daily I would recommend he see GI

## 2020-08-24 ENCOUNTER — Telehealth: Payer: 59

## 2020-08-25 ENCOUNTER — Telehealth: Payer: 59

## 2020-09-06 ENCOUNTER — Other Ambulatory Visit: Payer: Self-pay | Admitting: Family Medicine

## 2020-09-06 NOTE — Telephone Encounter (Signed)
Pharmacy requests refill on: Carvedilol 25 mg   LAST REFILL: 02/24/2020 (Q-180, R-0) LAST OV: 01/19/2020 NEXT OV: Not Scheduled  PHARMACY: [facility] [phone]

## 2020-10-17 ENCOUNTER — Other Ambulatory Visit: Payer: Self-pay | Admitting: Family Medicine

## 2020-10-17 DIAGNOSIS — I10 Essential (primary) hypertension: Secondary | ICD-10-CM

## 2020-10-17 DIAGNOSIS — I5042 Chronic combined systolic (congestive) and diastolic (congestive) heart failure: Secondary | ICD-10-CM

## 2020-11-25 ENCOUNTER — Other Ambulatory Visit: Payer: Self-pay | Admitting: Family Medicine

## 2020-11-25 DIAGNOSIS — I10 Essential (primary) hypertension: Secondary | ICD-10-CM

## 2020-11-25 NOTE — Telephone Encounter (Signed)
Please call pt and have him schedule an annual physical some time in April. Refill for 3 months sent in.

## 2020-11-28 NOTE — Telephone Encounter (Signed)
Called patient and stated he will call back to schedule cpe.

## 2021-02-13 ENCOUNTER — Other Ambulatory Visit: Payer: Self-pay | Admitting: Family Medicine

## 2021-02-13 DIAGNOSIS — I5042 Chronic combined systolic (congestive) and diastolic (congestive) heart failure: Secondary | ICD-10-CM

## 2021-02-13 DIAGNOSIS — E1169 Type 2 diabetes mellitus with other specified complication: Secondary | ICD-10-CM

## 2021-02-13 DIAGNOSIS — E785 Hyperlipidemia, unspecified: Secondary | ICD-10-CM

## 2021-02-13 NOTE — Telephone Encounter (Signed)
Please call patient and schedule annual physical. Send back to CMA for refill after appointment is scheduled. 

## 2021-02-15 NOTE — Telephone Encounter (Signed)
Pt has been scheduled for CPE on 6/22

## 2021-03-08 ENCOUNTER — Other Ambulatory Visit: Payer: Self-pay | Admitting: Family Medicine

## 2021-03-08 DIAGNOSIS — I10 Essential (primary) hypertension: Secondary | ICD-10-CM

## 2021-03-28 ENCOUNTER — Other Ambulatory Visit: Payer: Self-pay

## 2021-03-29 ENCOUNTER — Other Ambulatory Visit: Payer: Self-pay

## 2021-03-29 ENCOUNTER — Ambulatory Visit (INDEPENDENT_AMBULATORY_CARE_PROVIDER_SITE_OTHER): Payer: 59 | Admitting: Family Medicine

## 2021-03-29 VITALS — BP 108/72 | HR 79 | Temp 97.8°F | Resp 16 | Ht 62.75 in | Wt 224.2 lb

## 2021-03-29 DIAGNOSIS — N529 Male erectile dysfunction, unspecified: Secondary | ICD-10-CM | POA: Diagnosis not present

## 2021-03-29 DIAGNOSIS — Z114 Encounter for screening for human immunodeficiency virus [HIV]: Secondary | ICD-10-CM

## 2021-03-29 DIAGNOSIS — E785 Hyperlipidemia, unspecified: Secondary | ICD-10-CM

## 2021-03-29 DIAGNOSIS — Z0000 Encounter for general adult medical examination without abnormal findings: Secondary | ICD-10-CM

## 2021-03-29 DIAGNOSIS — I10 Essential (primary) hypertension: Secondary | ICD-10-CM | POA: Diagnosis not present

## 2021-03-29 DIAGNOSIS — Z1159 Encounter for screening for other viral diseases: Secondary | ICD-10-CM

## 2021-03-29 DIAGNOSIS — E119 Type 2 diabetes mellitus without complications: Secondary | ICD-10-CM

## 2021-03-29 DIAGNOSIS — Z1211 Encounter for screening for malignant neoplasm of colon: Secondary | ICD-10-CM

## 2021-03-29 DIAGNOSIS — E1169 Type 2 diabetes mellitus with other specified complication: Secondary | ICD-10-CM

## 2021-03-29 DIAGNOSIS — L989 Disorder of the skin and subcutaneous tissue, unspecified: Secondary | ICD-10-CM

## 2021-03-29 LAB — LIPID PANEL
Cholesterol: 146 mg/dL (ref 0–200)
HDL: 42.9 mg/dL (ref >39.00)
LDL Cholesterol: 86 mg/dL (ref 0–99)
NonHDL: 103.48
Total CHOL/HDL Ratio: 3
Triglycerides: 86 mg/dL (ref 0.0–149.0)
VLDL: 17.2 mg/dL (ref 0.0–40.0)

## 2021-03-29 LAB — MICROALBUMIN / CREATININE URINE RATIO
Creatinine,U: 116.9 mg/dL
Microalb Creat Ratio: 0.6 mg/g (ref 0.0–30.0)
Microalb, Ur: <0.7 mg/dL (ref 0.0–1.9)

## 2021-03-29 LAB — CBC
HCT: 42.1 % (ref 39.0–52.0)
Hemoglobin: 14.7 g/dL (ref 13.0–17.0)
MCHC: 35 g/dL (ref 30.0–36.0)
MCV: 92.7 fl (ref 78.0–100.0)
Platelets: 198 10*3/uL (ref 150.0–400.0)
RBC: 4.54 Mil/uL (ref 4.22–5.81)
RDW: 13.2 % (ref 11.5–15.5)
WBC: 5.3 10*3/uL (ref 4.0–10.5)

## 2021-03-29 LAB — COMPREHENSIVE METABOLIC PANEL
ALT: 20 U/L (ref 0–53)
AST: 21 U/L (ref 0–37)
Albumin: 4.4 g/dL (ref 3.5–5.2)
Alkaline Phosphatase: 50 U/L (ref 39–117)
BUN: 21 mg/dL (ref 6–23)
CO2: 30 mEq/L (ref 19–32)
Calcium: 9.5 mg/dL (ref 8.4–10.5)
Chloride: 100 mEq/L (ref 96–112)
Creatinine, Ser: 1 mg/dL (ref 0.40–1.50)
GFR: 87.21 mL/min (ref >60.00)
Glucose, Bld: 86 mg/dL (ref 70–99)
Potassium: 4.5 mEq/L (ref 3.5–5.1)
Sodium: 137 mEq/L (ref 135–145)
Total Bilirubin: 0.9 mg/dL (ref 0.2–1.2)
Total Protein: 7.3 g/dL (ref 6.0–8.3)

## 2021-03-29 LAB — HEMOGLOBIN A1C: Hgb A1c MFr Bld: 5.6 % (ref 4.6–6.5)

## 2021-03-29 LAB — TESTOSTERONE: Testosterone: 268.5 ng/dL — ABNORMAL LOW (ref 300.00–890.00)

## 2021-03-29 LAB — TSH: TSH: 2.57 u[IU]/mL (ref 0.35–4.50)

## 2021-03-29 MED ORDER — SILDENAFIL CITRATE 20 MG PO TABS: 20.0000 mg | ORAL_TABLET | Freq: Every day | ORAL | 0 refills | Status: AC | PRN

## 2021-03-29 NOTE — Patient Instructions (Signed)
#  Erectile Dysfunction - try Viagra - if not working call or mychart back  #Labs - if testosterone low will plan repeat or urology referral

## 2021-03-29 NOTE — Assessment & Plan Note (Addendum)
Etiology likely mixed with DM, CHF, and low testosterone. Trial of sildenafil. In response anticipate urology referral

## 2021-03-29 NOTE — Progress Notes (Signed)
Annual Exam   Chief Complaint:  Chief Complaint  Patient presents with   Annual Exam    History of Present Illness:  Bruce Alvarez is a 50 y.o. presents today for annual examination.    #ED - worse  - has been an issue since starting on his CHF medications  - has since lost 85 lbs - has not taking his medication sildenafil   Nutrition/Lifestyle Diet: diabetes diet - doing we Exercise: walking daily He is no 2/2 to ED - but hoping to get back to this with wife.  Any issues with getting or keeping erection? Yes  Social History   Tobacco Use  Smoking Status Some Days   Packs/day: 0.00   Years: 15.00   Pack years: 0.00   Types: Cigars, Cigarettes  Smokeless Tobacco Never  Tobacco Comments   Never smoked cigarettes. Only occasional cigar.   Social History   Substance and Sexual Activity  Alcohol Use Yes   Comment: 2-3 drinks on sunday   Social History   Substance and Sexual Activity  Drug Use Never     Safety The patient wears seatbelts: yes.     The patient feels safe at home and in their relationships: yes.  General Health Dentist in the last year: No Eye doctor: no  Weight Wt Readings from Last 3 Encounters:  03/29/21 224 lb 4 oz (101.7 kg)  01/19/20 222 lb (100.7 kg)  01/05/20 221 lb 12 oz (100.6 kg)   Patient has high BMI  BMI Readings from Last 1 Encounters:  03/29/21 40.04 kg/m     Chronic disease screening Blood pressure monitoring:  BP Readings from Last 3 Encounters:  03/29/21 108/72  01/19/20 130/88  01/05/20 124/78    Lipid Monitoring: Indication for screening: age >35, obesity, diabetes, family hx, CV risk factors.  Lipid screening: Yes  Lab Results  Component Value Date   CHOL 200 01/05/2020   HDL 36.90 (L) 01/05/2020   LDLCALC 146 (H) 01/05/2020   TRIG 87.0 01/05/2020   CHOLHDL 5 01/05/2020     Diabetes Screening: age >91, overweight, family hx, PCOS, hx of gestational diabetes, at risk ethnicity, elevated blood  pressure >135/80.  Diabetes Screening screening: Yes  Lab Results  Component Value Date   HGBA1C 5.4 01/05/2020     Prostate Cancer Screening: Not Indicated Age 46-69 yo Shared Decision Making Higher Risk: Older age, African American, Family Hx of Prostate Cancer - No Benefits: screening may prevent 1.3 deaths from prostate cancer over 13 years per 1000 men screened and prevent 3 metastatic cases per 1000 men screened. Not enough evidence to support more benefit for AA or FMH Harms: False Positive and psychological harms. 15% of me with false positive over a 2 to 4 year period > resulting in biopsy and complications such as pain, hematospermia, infections. Overdiagnosis - increases with age - found that 20-50% of prostate cancer through screening may have never caused any issues. Harms of treatment include - erectile dysfunction, urinary incontinence, and bothersome bowel symptoms.   After discussion he does not want to get a PSA checked today.   Inadequate evidence for screening <55 No mortality benefit for screening >70   No results found for: PSA1, PSA     Colon Cancer Screening:  Age 64-75 yo - benefits outweigh the risk. Adults 62-85 yo who have never been screened benefit.  Benefits: 134000 people in 2016 will be diagnosed and 49,000 will die - early detection helps Harms: Complications 2/2 to colonoscopy  High Risk (Colonoscopy): genetic disorder (Lynch syndrome or familial adenomatous polyposis), personal hx of IBD, previous adenomatous polyp, or previous colorectal cancer, FamHx start 10 years before the age at diagnosis, increased in males and black race  Options:  FIT - looks for hemoglobin (blood in the stool) - specific and fairly sensitive - must be done annually Cologuard - looks for DNA and blood - more sensitive - therefore can have more false positives, every 3 years Colonoscopy - every 10 years if normal - sedation, bowl prep, must have someone drive you  Shared  decision making and the patient had decided to do colonscopy.   Social History   Tobacco Use  Smoking Status Some Days   Packs/day: 0.00   Years: 15.00   Pack years: 0.00   Types: Cigars, Cigarettes  Smokeless Tobacco Never  Tobacco Comments   Never smoked cigarettes. Only occasional cigar.    Lung Cancer Screening (Ages 27-80): not applicable   Abdominal Aortic Aneurysm:  Age 78-75, 1 time screening, men who have ever smoked no age    Past Medical History:  Diagnosis Date   Alcohol abuse    Anxiety    Cholecystitis    Chronic combined systolic and diastolic CHF (congestive heart failure) (HCC)    a. Dx 05/2015 - [facility]. EF 25-30%, grade 1 DD, mod-severe LVH, moderate-severely reduced RV function as well. Cath 8/8 with normal cors, severe pulm HTN, elevated LVEDP.  ? Hypertensive heart disease vs alcoholic cardiomyopathy.   Depression    "took effexor til ~ 02/2015" (05/11/2015)   Diabetes mellitus (HCC)    Hyperlipidemia    Hypertension    Hypoaldosteronism (HCC)    Low testosterone    Morbid obesity (HCC)    Pulmonary hypertension (HCC)    a. By cath 05/2015.   Sleep apnea    "not technically dx'd by a physician" (05/11/2015)    Past Surgical History:  Procedure Laterality Date   CARDIAC CATHETERIZATION N/A 05/16/2015   Procedure: Right/Left Heart Cath and Coronary Angiography;  Surgeon: Kathleene Hazel, MD;  Location: [facility] INVASIVE CV LAB;  Service: Cardiovascular;  Laterality: N/A;   ERCP N/A 05/12/2015   Procedure: ENDOSCOPIC RETROGRADE CHOLANGIOPANCREATOGRAPHY (ERCP);  Surgeon: Dorena Cookey, MD;  Location: [facility] ENDOSCOPY;  Service: Endoscopy;  Laterality: N/A;    Prior to Admission medications   Medication Sig Start Date End Date Taking? Authorizing Provider  amLODipine (NORVASC) 5 MG tablet TAKE ONE TABLET BY MOUTH ONE TIME DAILY 10/17/20  Yes Lynnda Child, MD  atorvastatin (LIPITOR) 10 MG tablet TAKE ONE TABLET BY MOUTH ONE TIME DAILY 02/16/21  Yes Lynnda Child, MD  carvedilol (COREG) 25 MG tablet TAKE ONE TABLET BY MOUTH TWICE DAILY WITH A MEAL 09/06/20  Yes Lynnda Child, MD  furosemide (LASIX) 40 MG tablet TAKE ONE TABLET BY MOUTH TWICE DAILY 02/16/21  Yes Lynnda Child, MD  losartan (COZAAR) 100 MG tablet TAKE ONE TABLET BY MOUTH ONE TIME DAILY 03/09/21  Yes Lynnda Child, MD  Multiple Vitamin (MULTIVITAMIN WITH MINERALS) TABS tablet Take 1 tablet by mouth daily.   Yes [provider]  sildenafil (REVATIO) 20 MG tablet Take 20 mg by mouth. Up to 5 tablets daily as needed   Yes [provider]    Allergies  Allergen Reactions   Benzocaine Swelling   Other     Pt reports he is allergic to -caines     Social History   Socioeconomic History   Marital  status: Married    Spouse name: Herbert Seta   Number of children: 3   Years of education: some college   Highest education level: Not on file  Occupational History   Occupation: Retail banker    Comment: [employer]  Tobacco Use   Smoking status: Some Days    Packs/day: 0.00    Years: 15.00    Pack years: 0.00    Types: Cigars, Cigarettes   Smokeless tobacco: Never   Tobacco comments:    Never smoked cigarettes. Only occasional cigar.  Substance and Sexual Activity   Alcohol use: Yes    Comment: 2-3 drinks on sunday   Drug use: Never   Sexual activity: Yes    Birth control/protection: Surgical    Comment: Wife has tubile ligation.  Other Topics Concern   Not on file  Social History Narrative   01/19/20   From: [place] originally, moved for work   Living: with wife Research scientist (physical sciences) (1992), 2 kids at home   Work: runs stores for a Restaurant manager, fast food      Family: 3 children - Gerilyn Pilgrim (2005) , Molli Hazard, and Walker and then adult, 1 grandchild      Enjoys: work keeps him busy, golf, Presenter, broadcasting      Exercise: walking 2 miles per day   Diet: low carb/low fat/low cholesterol, under 1250 calories and intermittent fasting      Safety   Seat belts: Yes    Guns: Yes   and secure   Safe in relationships: Yes    Social Determinants of Corporate investment banker Strain: Not on file  Food Insecurity: Not on file  Transportation Needs: Not on file  Physical Activity: Not on file  Stress: Not on file  Social Connections: Not on file  Intimate Partner Violence: Not on file    Family History  Problem Relation Age of Onset   Heart attack Father 48       died from the "widow maker" at age 3   Hypertension Father    Heart disease Father    Breast cancer Mother    Hypertension Mother    Varicose Veins Mother    Heart attack Paternal Grandfather 61   Breast cancer Maternal Grandmother    Stroke Neg Hx     Review of Systems  Constitutional:  Negative for chills and fever.  HENT:  Negative for congestion and sore throat.   Eyes:  Negative for blurred vision and double vision.  Respiratory:  Negative for shortness of breath.   Cardiovascular:  Negative for chest pain.  Gastrointestinal:  Negative for heartburn, nausea and vomiting.  Genitourinary: Negative.   Musculoskeletal: Negative.  Negative for myalgias.  Skin:  Negative for rash.  Neurological:  Negative for dizziness and headaches.  Endo/Heme/Allergies:  Does not bruise/bleed easily.  Psychiatric/Behavioral:  Negative for depression. The patient is not nervous/anxious.     Physical Exam BP 108/72   Pulse 79   Temp 97.8 F (36.6 C)   Resp 16   Ht 5' 2.75" (1.594 m)   Wt 224 lb 4 oz (101.7 kg)   SpO2 98%   BMI 40.04 kg/m    BP Readings from Last 3 Encounters:  03/29/21 108/72  01/19/20 130/88  01/05/20 124/78      Physical Exam Constitutional:      General: He is not in acute distress.    Appearance: He is well-developed. He is not diaphoretic.  HENT:     Head: Normocephalic and  atraumatic.     Right Ear: Tympanic membrane and ear canal normal.     Left Ear: Tympanic membrane and ear canal normal.     Nose: Nose normal.     Mouth/Throat:     Pharynx: Uvula midline.   Eyes:     General: No scleral icterus.    Conjunctiva/sclera: Conjunctivae normal.     Pupils: Pupils are equal, round, and reactive to light.  Cardiovascular:     Rate and Rhythm: Normal rate and regular rhythm.     Heart sounds: Normal heart sounds. No murmur heard. Pulmonary:     Effort: Pulmonary effort is normal. No respiratory distress.     Breath sounds: Normal breath sounds. No wheezing.  Abdominal:     General: Bowel sounds are normal. There is no distension.     Palpations: Abdomen is soft. There is no mass.     Tenderness: There is no abdominal tenderness. There is no guarding.  Musculoskeletal:        General: Normal range of motion.     Cervical back: Normal range of motion and neck supple.  Lymphadenopathy:     Cervical: No cervical adenopathy.  Skin:    General: Skin is warm and dry.     Capillary Refill: Capillary refill takes less than 2 seconds.  Neurological:     Mental Status: He is alert and oriented to person, place, and time.       Results:  PHQ-9:  Flowsheet Row Office Visit from 03/29/2021 in [facility] [place]  PHQ-9 Total Score 1      Depression screen [facility] 2/9 03/29/2021  Decreased Interest 0  Down, Depressed, Hopeless 0  PHQ - 2 Score 0  Altered sleeping 1  Tired, decreased energy 0  Change in appetite 0  Feeling bad or failure about yourself  0  Trouble concentrating 0  Moving slowly or fidgety/restless 0  Suicidal thoughts 0  PHQ-9 Score 1  Difficult doing work/chores Not difficult at all       Assessment: 50 y.o. here for routine annual physical examination.  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 Trial of sildenafil. In response anticipate urology referral       Relevant Medications   sildenafil (REVATIO) 20 MG tablet   Other Relevant Orders   Testosterone   Other Visit Diagnoses     Annual physical exam    -  Primary   Colon cancer screening       Relevant Orders   Ambulatory referral to gastroenterology for colonoscopy   Need for hepatitis C screening test       Relevant Orders   Hepatitis C antibody   Encounter for screening for HIV       Relevant Orders   HIV Antibody (routine testing w rflx)   Non-healing skin  lesion of nose       Relevant Orders   Ambulatory referral to Dermatology       Screening: -- Blood pressure screen normal -- cholesterol screening: will obtain -- Weight screening: overweight: continue to monitor -- Diabetes Screening: will obtain -- Nutrition: normal - Encouraged healthy diet  The 10-year ASCVD risk score Denman George(Goff DC Jr., et al., 2013) is: 17.7%   Values used to calculate the score:     Age: [age] years     Sex: Male     Is Non-Hispanic African American: No     Diabetic: Yes     Tobacco smoker: Yes     Systolic Blood Pressure: 108 mmHg     Is BP treated: Yes     HDL Cholesterol: 36.9 mg/dL     Total Cholesterol: 200 mg/dL  -- ASA 81 mg discussed if CVD risk >10% age 51-59 and willing to take for 10 years -- Statin therapy for Age 51-75 with CVD risk >7.5%  Psych -- Depression screening (PHQ-9): negative  Safety -- tobacco screening:  low risk use -- alcohol screening:  low-risk usage. -- no evidence of domestic violence or intimate partner violence.  Cancer Screening -- Prostate (age 51-69) not indicated -- Colon (age 51-75) ordered -- Lung not indicated    Immunizations Immunization History  Administered Date(s) Administered   [employer] CompanyModerna SARS-COV2 Booster Vaccination 08/11/2020   Moderna Sars-Covid-2 Vaccination 12/10/2019, 01/07/2020    -- flu vaccine not in season -- TDAP q10 years pt will schedule -- Shingles (age 90>50) pt will schedule -- PPSV-23 (19-64 with chronic disease or smoking) pt will schedule -- Covid-19 Vaccine up to date  Encouraged regular vision and dental screening. Encouraged healthy exercise and diet.   Lynnda ChildJessica R Jerick Khachatryan

## 2021-03-30 LAB — HEPATITIS C ANTIBODY
Hepatitis C Ab: NONREACTIVE
SIGNAL TO CUT-OFF: 0.01 (ref <1.00)

## 2021-03-30 LAB — HIV ANTIBODY (ROUTINE TESTING W REFLEX): HIV 1&2 Ab, 4th Generation: NONREACTIVE

## 2021-05-09 ENCOUNTER — Encounter: Payer: Self-pay | Admitting: Family Medicine

## 2021-05-09 DIAGNOSIS — G4709 Other insomnia: Secondary | ICD-10-CM

## 2021-05-09 MED ORDER — TEMAZEPAM 7.5 MG PO CAPS: 7.5000 mg | ORAL_CAPSULE | Freq: Every evening | ORAL | 0 refills | Status: DC | PRN

## 2021-05-16 NOTE — Telephone Encounter (Signed)
Pt needs an appointment. Can add on tomorrow at 12 pm

## 2021-05-16 NOTE — Telephone Encounter (Signed)
Bruce Alvarez called in and stated that he cant sleep and and he needs something. And she stated that to call her at [phone]

## 2021-05-16 NOTE — Telephone Encounter (Signed)
Pt's wife called stating the medication is not helping. Bruce Alvarez is still not sleeping and he is still having bad anxiety. Wife wanted an appointment today but we do not have anything available. She states he has tried increasing the dosage and it still is not helping. Please advise.

## 2021-05-17 ENCOUNTER — Ambulatory Visit: Payer: 59 | Admitting: Family Medicine

## 2021-05-17 ENCOUNTER — Other Ambulatory Visit: Payer: Self-pay | Admitting: Family Medicine

## 2021-05-17 ENCOUNTER — Other Ambulatory Visit: Payer: Self-pay

## 2021-05-17 VITALS — BP 130/80 | HR 77 | Temp 97.7°F | Ht 62.75 in | Wt 237.5 lb

## 2021-05-17 DIAGNOSIS — F5104 Psychophysiologic insomnia: Secondary | ICD-10-CM

## 2021-05-17 DIAGNOSIS — R7989 Other specified abnormal findings of blood chemistry: Secondary | ICD-10-CM

## 2021-05-17 DIAGNOSIS — I5042 Chronic combined systolic (congestive) and diastolic (congestive) heart failure: Secondary | ICD-10-CM

## 2021-05-17 DIAGNOSIS — E1169 Type 2 diabetes mellitus with other specified complication: Secondary | ICD-10-CM

## 2021-05-17 DIAGNOSIS — E785 Hyperlipidemia, unspecified: Secondary | ICD-10-CM

## 2021-05-17 DIAGNOSIS — F4323 Adjustment disorder with mixed anxiety and depressed mood: Secondary | ICD-10-CM

## 2021-05-17 MED ORDER — TRAZODONE HCL 50 MG PO TABS: 50.0000 mg | ORAL_TABLET | Freq: Every evening | ORAL | 3 refills | Status: DC | PRN

## 2021-05-17 NOTE — Progress Notes (Signed)
Subjective:     Bruce Alvarez is a 50 y.o. male presenting for Insomnia (X 2 weeks. Recently fired from job of 12 years )     HPI  #Insomnia - was fired from job 2 weeks ago - has caused significant stress and he is re-examining his life - thinks he's been hiding behind his work for a long time and not facing the issues that he had - work was a major part of his life - and is trying to deal with job loss - significant anxiety and depression - has a hx of depression and was on effexor in the past  - cannot shut down his mind at night - mind is racing - is keeping him from concentrating - was on temazepam in the past w/ success - has been taking this for a 1 week - he even tried 30 mg to see if it would work and woke up at 1 am - difficulty falling asleep and staying asleep - lack of sleep is starting to wear on his as well  Has had success with ambien and alprazolam in the past  Wonders about impact of low testosterone  Low T when treated in the past was effective - he felt better on this treatment  Review of Systems   Social History   Tobacco Use  Smoking Status Some Days   Packs/day: 0.00   Years: 15.00   Pack years: 0.00   Types: Cigars, Cigarettes  Smokeless Tobacco Never  Tobacco Comments   Never smoked cigarettes. Only occasional cigar.        Objective:    BP Readings from Last 3 Encounters:  05/17/21 130/80  03/29/21 108/72  01/19/20 130/88   Wt Readings from Last 3 Encounters:  05/17/21 237 lb 8 oz (107.7 kg)  03/29/21 224 lb 4 oz (101.7 kg)  01/19/20 222 lb (100.7 kg)    BP 130/80   Pulse 77   Temp 97.7 F (36.5 C) (Temporal)   Ht 5' 2.75" (1.594 m)   Wt 237 lb 8 oz (107.7 kg)   SpO2 97%   BMI 42.41 kg/m    Physical Exam Constitutional:      Appearance: Normal appearance. He is not ill-appearing or diaphoretic.  HENT:     Right Ear: External ear normal.     Left Ear: External ear normal.     Nose: Nose normal.  Eyes:      General: No scleral icterus.    Extraocular Movements: Extraocular movements intact.     Conjunctiva/sclera: Conjunctivae normal.  Cardiovascular:     Rate and Rhythm: Normal rate.  Pulmonary:     Effort: Pulmonary effort is normal.  Musculoskeletal:     Cervical back: Neck supple.  Skin:    General: Skin is warm and dry.  Neurological:     Mental Status: He is alert. Mental status is at baseline.  Psychiatric:        Mood and Affect: Mood normal.     Depression screen [facility] 2/9 05/17/2021 03/29/2021  Decreased Interest 2 0  Down, Depressed, Hopeless 3 0  PHQ - 2 Score 5 0  Altered sleeping 3 1  Tired, decreased energy 3 0  Change in appetite 1 0  Feeling bad or failure about yourself  3 0  Trouble concentrating 3 0  Moving slowly or fidgety/restless 3 0  Suicidal thoughts 0 0  PHQ-9 Score 21 1  Difficult doing work/chores Very difficult Not difficult at all  GAD 7 : Generalized Anxiety Score 05/17/2021  Nervous, Anxious, on Edge 3  Control/stop worrying 3  Worry too much - different things 3  Trouble relaxing 3  Restless 2  Easily annoyed or irritable 1  Afraid - awful might happen 1  Total GAD 7 Score 16  Anxiety Difficulty Very difficult         Assessment & Plan:   Problem List Items Addressed This Visit       Other   Low testosterone (Chronic)    Discussed that low suspicion that low T is causing insomnia, depression, or anxiety. Advised urology f/u to consider treatment       Relevant Orders   Ambulatory referral to Urology   Psychophysiological insomnia - Primary    Severe in setting of recent job loss with worsening anxiety. Encouraged therapy - though with loss of income/insurance likely not an option. Will start with trazodone (failed temazepam and melatonin). If no improvement will do alprazolam as this has helped in the past per patient. Sleep hygiene hand out as well       Relevant Medications   traZODone (DESYREL) 50 MG tablet   Adjustment  disorder with mixed anxiety and depressed mood    Elevated GAD and PHQ-9 in setting of recent job loss and insomnia. Will treat insomnia. Hand out for anxiety non-medication approaches. Pt previously on effexor so low threshold to consider restarting but with PHQ-9 of 1 in 03/2021 will watch and wait given significant life stressor.          Return in about 6 weeks (around 06/28/2021) for depression and anxiety and sleep.  Lynnda Child, MD  This visit occurred during the SARS-CoV-2 public health emergency.  Safety protocols were in place, including screening questions prior to the visit, additional usage of staff PPE, and extensive cleaning of exam room while observing appropriate contact time as indicated for disinfecting solutions.

## 2021-05-17 NOTE — Assessment & Plan Note (Signed)
Severe in setting of recent job loss with worsening anxiety. Encouraged therapy - though with loss of income/insurance likely not an option. Will start with trazodone (failed temazepam and melatonin). If no improvement will do alprazolam as this has helped in the past per patient. Sleep hygiene hand out as well

## 2021-05-17 NOTE — Assessment & Plan Note (Signed)
Elevated GAD and PHQ-9 in setting of recent job loss and insomnia. Will treat insomnia. Hand out for anxiety non-medication approaches. Pt previously on effexor so low threshold to consider restarting but with PHQ-9 of 1 in 03/2021 will watch and wait given significant life stressor.

## 2021-05-17 NOTE — Patient Instructions (Addendum)
Sleep medication 1) Trazodone - not habit forming. OK to increase dose up to 150 mg if needed. Increase by 25 mg  Contact if no improvement with sleep - send Mychart message  2) Alprazolam - to help with sleep    How to help anxiety - without medication.   1) Regular Exercise - walking, jogging, cycling, dancing, strength training --> Yoga has been shown in research to reduce depression and anxiety -- with even just one hour long session per week  2)  Begin a Mindfulness/Meditation practice -- this can take a little as 3 minutes and is helpful for all kinds of mood issues -- You can find resources in books -- Or you can download apps like  ---- Headspace App (which currently has free content called "Weathering the Storm") ---- Calm (which has a few free options)  ---- Insignt Timer ---- Stop, Breathe & Think  # With each of these Apps - you should decline the "start free trial" offer and as you search through the App should be able to access some of their free content. You can also chose to pay for the content if you find one that works well for you.   # Many of them also offer sleep specific content which may help with insomnia  3) Healthy Diet -- Avoid or decrease Caffeine -- Avoid or decrease Alcohol -- Drink plenty of water, have a balanced diet -- Avoid cigarettes and marijuana (as well as other recreational drugs)  4) Consider contacting a professional therapist  -- [facility] is one option. Call [phone] -- Or you can check out www.psychologytoday.com -- you can read bios of therapists and see if they accept insurance -- Check with your insurance to see if you have coverage and who may take your insurance    Sleep hygiene checklist: 1. Avoid naps during the day 2. Avoid stimulants such as caffeine and nicotine. Avoid bedtime alcohol (it can speed onset of sleep but the body's metabolism can cause awakenings). At least 2 hours before bedtime 3. All  forms of exercise help ensure sound sleep - limit vigorous exercise to morning or late afternoon 4. Avoid food too close to bedtime including chocolate (which contains caffeine) 5. Soak up natural light 6. Establish regular bedtime routine. 7. Associate bed with sleep - avoid TV, computer or phone, reading while in bed. 8. Ensure pleasant, relaxing sleep environment - quiet, dark, cool room.  Good Sleep Hygiene Habits -- Got to bed and wake up within an hour of the same time every day -- Avoid bright screens (from laptop, phone, TV) within at least 30 minutes before bed. The "blue light" supresses the sleep hormone melatonin and the content may stimulate as well -- Maintain a quiet and dark sleep environment (blackout curtains, turn on a fan or white noise to block out disruptive sounds) -- Practicing relaxing activites before bed (taking a shower, reading a book, journaling, meditation app) -- To quiet a busy mind -- consider journaling before bed (jotting down reminders, worry thoughts, as well as positive things like a gratitude list)

## 2021-05-17 NOTE — Assessment & Plan Note (Signed)
Discussed that low suspicion that low T is causing insomnia, depression, or anxiety. Advised urology f/u to consider treatment

## 2021-05-19 ENCOUNTER — Other Ambulatory Visit: Payer: Self-pay | Admitting: Family Medicine

## 2021-05-19 DIAGNOSIS — I10 Essential (primary) hypertension: Secondary | ICD-10-CM

## 2021-05-23 MED ORDER — ALPRAZOLAM 1 MG PO TABS: 0.5000 mg | ORAL_TABLET | Freq: Every evening | ORAL | 0 refills | Status: DC | PRN

## 2021-05-23 NOTE — Addendum Note (Signed)
Addended by: Lynnda Child on: 05/23/2021 08:05 AM   Modules accepted: Orders

## 2021-05-29 ENCOUNTER — Telehealth: Payer: Self-pay

## 2021-05-29 NOTE — Telephone Encounter (Signed)
Called patient he wants a call back later so he could figure out a day gonna call him at 3

## 2021-06-14 MED ORDER — ALPRAZOLAM 1 MG PO TABS: 0.5000 mg | ORAL_TABLET | Freq: Every evening | ORAL | 0 refills | Status: DC | PRN

## 2021-06-14 NOTE — Addendum Note (Signed)
Addended by: Doreene Nest on: 06/14/2021 06:51 AM   Modules accepted: Orders

## 2021-07-04 MED ORDER — ALPRAZOLAM 1 MG PO TABS: 0.5000 mg | ORAL_TABLET | Freq: Every evening | ORAL | 0 refills | Status: DC | PRN

## 2021-07-04 NOTE — Addendum Note (Signed)
Addended by: Lynnda Child on: 07/04/2021 07:44 AM   Modules accepted: Orders

## 2021-07-31 ENCOUNTER — Telehealth: Payer: Self-pay | Admitting: Family Medicine

## 2021-07-31 DIAGNOSIS — G4709 Other insomnia: Secondary | ICD-10-CM

## 2021-07-31 MED ORDER — ALPRAZOLAM 1 MG PO TABS: 0.5000 mg | ORAL_TABLET | Freq: Every evening | ORAL | 0 refills | Status: DC | PRN

## 2021-07-31 NOTE — Telephone Encounter (Signed)
Refill provided. Will need to have OV prior to any more refills

## 2021-07-31 NOTE — Telephone Encounter (Signed)
  Encourage patient to contact the pharmacy for refills or they can request refills through [facility]  LAST APPOINTMENT DATE:  Please schedule appointment if longer than 1 year  NEXT APPOINTMENT DATE:08/09/21  MEDICATION:ALPRAZolam (XANAX) 1 MG tablet  Is the patient out of medication? 1 pill left  PHARMACY: [facility] [address] [facility] - [place], [place] - [address] AT [address] Phone:  [phone]         Let patient know to contact pharmacy at the end of the day to make sure medication is ready.  Please notify patient to allow 48-72 hours to process  CLINICAL FILLS OUT ALL BELOW:   LAST REFILL:  QTY:  REFILL DATE:    OTHER COMMENTS:    Okay for refill?  Please advise

## 2021-08-09 ENCOUNTER — Other Ambulatory Visit: Payer: Self-pay

## 2021-08-09 ENCOUNTER — Encounter: Payer: Self-pay | Admitting: Family Medicine

## 2021-08-09 ENCOUNTER — Ambulatory Visit (INDEPENDENT_AMBULATORY_CARE_PROVIDER_SITE_OTHER): Payer: Self-pay | Admitting: Family Medicine

## 2021-08-09 VITALS — BP 118/78 | HR 83 | Temp 97.0°F | Ht 62.75 in | Wt 251.1 lb

## 2021-08-09 DIAGNOSIS — F331 Major depressive disorder, recurrent, moderate: Secondary | ICD-10-CM | POA: Insufficient documentation

## 2021-08-09 DIAGNOSIS — I5042 Chronic combined systolic (congestive) and diastolic (congestive) heart failure: Secondary | ICD-10-CM

## 2021-08-09 DIAGNOSIS — F411 Generalized anxiety disorder: Secondary | ICD-10-CM | POA: Insufficient documentation

## 2021-08-09 DIAGNOSIS — I10 Essential (primary) hypertension: Secondary | ICD-10-CM

## 2021-08-09 DIAGNOSIS — G4709 Other insomnia: Secondary | ICD-10-CM

## 2021-08-09 DIAGNOSIS — F5104 Psychophysiologic insomnia: Secondary | ICD-10-CM

## 2021-08-09 MED ORDER — VENLAFAXINE HCL ER 37.5 MG PO CP24: ORAL_CAPSULE | ORAL | 1 refills | Status: DC

## 2021-08-09 MED ORDER — LOSARTAN POTASSIUM 100 MG PO TABS: 100.0000 mg | ORAL_TABLET | Freq: Every day | ORAL | 3 refills | Status: AC

## 2021-08-09 MED ORDER — CARVEDILOL 25 MG PO TABS: 25.0000 mg | ORAL_TABLET | Freq: Two times a day (BID) | ORAL | 1 refills | Status: AC

## 2021-08-09 MED ORDER — ALPRAZOLAM 1 MG PO TABS: 0.5000 mg | ORAL_TABLET | Freq: Every evening | ORAL | 1 refills | Status: AC | PRN

## 2021-08-09 NOTE — Assessment & Plan Note (Signed)
Stable. Continue coreg 25 mg and furosemide 40 mg

## 2021-08-09 NOTE — Assessment & Plan Note (Addendum)
Poorly controled and recurrent depression w/ anxiety. Was previously on effexor for "years" he does feel depression resolved, but notes today that he thinks it is possible he has had depression persisting after stopping. Discussed as he does no recall side effects would recommend restarting effexor as first choice. Of note was on 150 mg previously. Will start 37.5 > 75 mg. If no improvement and tolerating after 3-4 weeks will plan to increase to 150 mg. Return in 6-8 weeks for check in. No job - but he is looking into low cost therapy options. SI risk discussed, he will reach out to wife and call hotline if severe symptoms

## 2021-08-09 NOTE — Patient Instructions (Signed)
You are going to start a new antidepressant medication.   One of the risks of this medication is increase in suicidal thoughts.   Your suicide Action plan is as follows:  1) Call wife 2) Call the Suicide Hotline [phone] which is available 24 hours 3) Call the Clinic    The most common side effect is stomach upset. If this happens it means the medication is working. It should get better in 1-3 weeks.   Medication for depression and anxiety often takes 6-8 weeks to have a noticeable difference so stick with it. Also the best way for recovery is taking medication and seeing a therapist -- this is so important.    How to help anxiety and depression  1) Regular Exercise - walking, jogging, cycling, dancing, strength training - aiming for 150 minutes of exercise a week --> Yoga has been shown in research to reduce depression and anxiety -- with even just one hour long session per week  2)  Begin a Mindfulness/Meditation practice -- this can take a little as 3 minutes and is helpful for all kinds of mood issues -- You can find resources in books -- Or you can download apps like  ---- Headspace App  ---- Calm  ---- Insignt Timer ---- Stop, Breathe & Think  # With each of these Apps - you should decline the "start free trial" offer and as you search through the App should be able to access some of their free content. You can also chose to pay for the content if you find one that works well for you.   # Many of them also offer sleep specific content which may help with insomnia  3) Healthy Diet -- Avoid or decrease Caffeine -- Avoid or decrease Alcohol -- Drink plenty of water, have a balanced diet -- Avoid cigarettes and marijuana (as well as other recreational drugs)  4) Find a therapist  -- [facility] is one option. Call [phone] -- Or you can check out www.psychologytoday.com -- you can read bios of therapists and see if they accept insurance -- Check  with your insurance to see if you have coverage and who may take your insurance  Can also look into - online programs which may or may not be less expensive than therapy   [place] location:   [facility] [phone]-  -Counseling/therapy (14 years and up- [facility] only)   -Psychiatry services most insurances accepted-treat and evaluate/diagnose patients and prescribe medications.  -Medication Management 51 years old to 65 years.  -Diagnoses treated: Depression/anxiety, ADHD, Substance Abuse, Bipolar Disorder, Psychotherapy, Pain Management, Spiritual Care Services.  [facility] (39 years of age and older) [phone]- Psychology services/therapy only.  [employer] [phone]- All ages-Just Therapy services   [employer] [phone]- Employee Assistance Programs-counseling only.   [facility] [facility] [phone]- All ages-Psychology and Psychiatrist services (evaluate, treat, diagnose, prescribe medication) Treat all the diagnoses that would fall under Behavioral issues.   For new patients, on Monday, Wednesday and Friday from 8 am to 3 pm patient would just walk in and fill out paperwork and they would get patient enrolled in the services they need based on their answers.   [employer] [phone]- All ages. Monday through Friday-9am to 4 pm walk in times for patients to come in and be evaluated. Medication Management only at this time. No therapy available.   [place] location:   [facility] Address: [address] [place] [address]  Phone: [phone] Counseling and psychiatry services.   [facility] and [place] locations)- [phone]- all ages, only therapy/counseling services/psychological evaluations. Services: aptitude testing, academic achievement testing, learning Disability evaluation, ADHD evaluations,  psycho-educational evaluations, readiness for kindergarten Evaluations.   [place] [phone] location only has therapy services right now  [facility] [phone] services only. Works off [employer] in [employer].

## 2021-08-09 NOTE — Progress Notes (Signed)
Subjective:     Bruce Alvarez is a 50 y.o. male presenting for Anxiety (Has increased since losing his job ) and Depression     Anxiety    Depression        Past medical history includes anxiety.    #Anxiety/Depression/insomnia - worse since losing his job - initially was given xanax for prn use - has used #70 since 05/23/2021 ~1 daily - was previously on effexor - feels like his depression has always been there - never really went away - thinks he was using work to hide anxiety - was on effexor for a long time - has days where he does not want to get out of bed - does have thoughts of being better off dead - but no plan to act on this - crying every day  Review of Systems  Psychiatric/Behavioral:  Positive for depression.    05/17/2021: Clinic - insomnia/anxiety - trazodone 50 mg - on xanax 1 mg as well   Social History   Tobacco Use  Smoking Status Some Days   Packs/day: 0.00   Years: 15.00   Pack years: 0.00   Types: Cigars, Cigarettes  Smokeless Tobacco Never  Tobacco Comments   Never smoked cigarettes. Only occasional cigar.        Objective:    BP Readings from Last 3 Encounters:  08/09/21 118/78  05/17/21 130/80  03/29/21 108/72   Wt Readings from Last 3 Encounters:  08/09/21 251 lb 2 oz (113.9 kg)  05/17/21 237 lb 8 oz (107.7 kg)  03/29/21 224 lb 4 oz (101.7 kg)    BP 118/78   Pulse 83   Temp (!) 97 F (36.1 C) (Temporal)   Ht 5' 2.75" (1.594 m)   Wt 251 lb 2 oz (113.9 kg)   SpO2 96%   BMI 44.84 kg/m    Physical Exam Constitutional:      Appearance: Normal appearance. He is not ill-appearing or diaphoretic.  HENT:     Right Ear: External ear normal.     Left Ear: External ear normal.     Nose: Nose normal.  Eyes:     General: No scleral icterus.    Extraocular Movements: Extraocular movements intact.     Conjunctiva/sclera: Conjunctivae normal.  Cardiovascular:     Rate and Rhythm: Normal rate.  Pulmonary:     Effort:  Pulmonary effort is normal.  Musculoskeletal:     Cervical back: Neck supple.  Skin:    General: Skin is warm and dry.  Neurological:     Mental Status: He is alert. Mental status is at baseline.  Psychiatric:        Mood and Affect: Mood normal.   Depression screen [facility] 2/9 08/09/2021 05/17/2021 03/29/2021  Decreased Interest 3 2 0  Down, Depressed, Hopeless 3 3 0  PHQ - 2 Score 6 5 0  Altered sleeping 3 3 1   Tired, decreased energy 3 3 0  Change in appetite 1 1 0  Feeling bad or failure about yourself  3 3 0  Trouble concentrating 3 3 0  Moving slowly or fidgety/restless 3 3 0  Suicidal thoughts 1 0 0  PHQ-9 Score 23 21 1   Difficult doing work/chores Extremely dIfficult Very difficult Not difficult at all   GAD 7 : Generalized Anxiety Score 08/09/2021 05/17/2021  Nervous, Anxious, on Edge 3 3  Control/stop worrying 3 3  Worry too much - different things 3 3  Trouble relaxing 3 3  Restless (  No Data) 2  Easily annoyed or irritable 2 1  Afraid - awful might happen 1 1  Total GAD 7 Score - 16  Anxiety Difficulty Extremely difficult Very difficult           Assessment & Plan:   Problem List Items Addressed This Visit       Cardiovascular and Mediastinum   Essential hypertension    BP at goal. Cont coreg 25 mg bid, losartan 100 mg      Relevant Medications   losartan (COZAAR) 100 MG tablet   carvedilol (COREG) 25 MG tablet   Chronic combined systolic and diastolic CHF (congestive heart failure) (HCC)    Stable. Continue coreg 25 mg and furosemide 40 mg      Relevant Medications   losartan (COZAAR) 100 MG tablet   carvedilol (COREG) 25 MG tablet     Other   Psychophysiological insomnia    Some response to Xanax. Failed trazodone. He is interested in [place], but discussed would not continue xanax. At this time will focus on depression/anxiety treatment and continue prn xanax for sleep. If anxiety improves and insomnia persists reasonable to consider trial of  ambien.       Moderate episode of recurrent major depressive disorder (HCC) - Primary    Poorly controled and recurrent depression w/ anxiety. Was previously on effexor for "years" he does feel depression resolved, but notes today that he thinks it is possible he has had depression persisting after stopping. Discussed as he does no recall side effects would recommend restarting effexor as first choice. Of note was on 150 mg previously. Will start 37.5 > 75 mg. If no improvement and tolerating after 3-4 weeks will plan to increase to 150 mg. Return in 6-8 weeks for check in. No job - but he is looking into low cost therapy options. SI risk discussed, he will reach out to wife and call hotline if severe symptoms      Relevant Medications   venlafaxine XR (EFFEXOR XR) 37.5 MG 24 hr capsule   ALPRAZolam (XANAX) 1 MG tablet   GAD (generalized anxiety disorder)    See depression. Start effexor. Return 6-8 weeks      Relevant Medications   venlafaxine XR (EFFEXOR XR) 37.5 MG 24 hr capsule   ALPRAZolam (XANAX) 1 MG tablet   Other Visit Diagnoses     Other insomnia       Relevant Medications   ALPRAZolam (XANAX) 1 MG tablet        Return in about 8 weeks (around 10/04/2021) for depression/anxiety check in .  Lynnda Child, MD  This visit occurred during the SARS-CoV-2 public health emergency.  Safety protocols were in place, including screening questions prior to the visit, additional usage of staff PPE, and extensive cleaning of exam room while observing appropriate contact time as indicated for disinfecting solutions.

## 2021-08-09 NOTE — Assessment & Plan Note (Signed)
BP at goal. Cont coreg 25 mg bid, losartan 100 mg

## 2021-08-09 NOTE — Assessment & Plan Note (Signed)
See depression. Start effexor. Return 6-8 weeks

## 2021-08-09 NOTE — Assessment & Plan Note (Signed)
Some response to Xanax. Failed trazodone. He is interested in [place], but discussed would not continue xanax. At this time will focus on depression/anxiety treatment and continue prn xanax for sleep. If anxiety improves and insomnia persists reasonable to consider trial of ambien.

## 2021-08-10 ENCOUNTER — Encounter: Payer: Self-pay | Admitting: Family Medicine

## 2021-10-03 ENCOUNTER — Telehealth: Payer: Self-pay | Admitting: Family Medicine

## 2021-10-03 DIAGNOSIS — F331 Major depressive disorder, recurrent, moderate: Secondary | ICD-10-CM

## 2021-10-03 DIAGNOSIS — F411 Generalized anxiety disorder: Secondary | ICD-10-CM

## 2021-10-04 NOTE — Telephone Encounter (Signed)
Pt stated he will call dr cody up once he see about insurance
